# Patient Record
Sex: Male | Born: 1975 | Race: Black or African American | Hispanic: No | Marital: Single | Smoking: Former smoker
Health system: Southern US, Community
[De-identification: ages and names within clinical notes are randomized; demographics above are authoritative.]

## PROBLEM LIST (undated history)

## (undated) DIAGNOSIS — G43909 Migraine, unspecified, not intractable, without status migrainosus: Secondary | ICD-10-CM

## (undated) DIAGNOSIS — M109 Gout, unspecified: Secondary | ICD-10-CM

## (undated) DIAGNOSIS — I10 Essential (primary) hypertension: Secondary | ICD-10-CM

---

## 2002-08-30 ENCOUNTER — Emergency Department (HOSPITAL_COMMUNITY): Admission: EM | Admit: 2002-08-30 | Discharge: 2002-08-30 | Payer: Self-pay | Admitting: Emergency Medicine

## 2003-09-10 ENCOUNTER — Emergency Department (HOSPITAL_COMMUNITY): Admission: EM | Admit: 2003-09-10 | Discharge: 2003-09-10 | Payer: Self-pay | Admitting: Internal Medicine

## 2005-07-28 ENCOUNTER — Emergency Department (HOSPITAL_COMMUNITY): Admission: EM | Admit: 2005-07-28 | Discharge: 2005-07-28 | Payer: Self-pay | Admitting: Emergency Medicine

## 2011-03-16 ENCOUNTER — Observation Stay (HOSPITAL_COMMUNITY)
Admission: EM | Admit: 2011-03-16 | Discharge: 2011-03-18 | Disposition: A | Discharge status: Home / Self Care | Payer: Self-pay | Attending: Internal Medicine | Admitting: Internal Medicine

## 2011-03-16 ENCOUNTER — Emergency Department (HOSPITAL_COMMUNITY): Payer: Self-pay

## 2011-03-16 DIAGNOSIS — R51 Headache: Secondary | ICD-10-CM | POA: Insufficient documentation

## 2011-03-16 DIAGNOSIS — Z87891 Personal history of nicotine dependence: Secondary | ICD-10-CM | POA: Insufficient documentation

## 2011-03-16 DIAGNOSIS — E785 Hyperlipidemia, unspecified: Secondary | ICD-10-CM | POA: Insufficient documentation

## 2011-03-16 DIAGNOSIS — N189 Chronic kidney disease, unspecified: Secondary | ICD-10-CM | POA: Insufficient documentation

## 2011-03-16 DIAGNOSIS — E669 Obesity, unspecified: Secondary | ICD-10-CM | POA: Insufficient documentation

## 2011-03-16 DIAGNOSIS — I129 Hypertensive chronic kidney disease with stage 1 through stage 4 chronic kidney disease, or unspecified chronic kidney disease: Principal | ICD-10-CM | POA: Insufficient documentation

## 2011-03-16 LAB — DIFFERENTIAL
Basophils Absolute: 0.1 10*3/uL (ref 0.0–0.1)
Basophils Relative: 0 % (ref 0–1)
Eosinophils Absolute: 0.1 10*3/uL (ref 0.0–0.7)
Eosinophils Relative: 1 % (ref 0–5)
Lymphocytes Relative: 20 % (ref 12–46)
Lymphs Abs: 2.5 10*3/uL (ref 0.7–4.0)
Monocytes Absolute: 0.8 10*3/uL (ref 0.1–1.0)
Monocytes Relative: 6 % (ref 3–12)
Neutro Abs: 9.1 10*3/uL — ABNORMAL HIGH (ref 1.7–7.7)
Neutrophils Relative %: 73 % (ref 43–77)

## 2011-03-16 LAB — URINE MICROSCOPIC-ADD ON

## 2011-03-16 LAB — LIPID PANEL
Cholesterol: 204 mg/dL — ABNORMAL HIGH (ref 0–200)
HDL: 31 mg/dL — ABNORMAL LOW (ref >39)
Total CHOL/HDL Ratio: 6.6 RATIO
VLDL: 37 mg/dL (ref 0–40)

## 2011-03-16 LAB — CBC
HCT: 38.1 % — ABNORMAL LOW (ref 39.0–52.0)
Hemoglobin: 13.8 g/dL (ref 13.0–17.0)
MCH: 30 pg (ref 26.0–34.0)
MCHC: 36.2 g/dL — ABNORMAL HIGH (ref 30.0–36.0)
MCV: 82.8 fL (ref 78.0–100.0)
Platelets: 254 10*3/uL (ref 150–400)
RBC: 4.6 MIL/uL (ref 4.22–5.81)
RDW: 13.7 % (ref 11.5–15.5)
WBC: 12.5 10*3/uL — ABNORMAL HIGH (ref 4.0–10.5)

## 2011-03-16 LAB — URINALYSIS, ROUTINE W REFLEX MICROSCOPIC
Bilirubin Urine: NEGATIVE
Glucose, UA: NEGATIVE mg/dL
Hgb urine dipstick: NEGATIVE
Ketones, ur: NEGATIVE mg/dL
Nitrite: NEGATIVE
Protein, ur: 100 mg/dL — AB
Specific Gravity, Urine: 1.026 (ref 1.005–1.030)
Urobilinogen, UA: 0.2 mg/dL (ref 0.0–1.0)
pH: 5 (ref 5.0–8.0)

## 2011-03-16 LAB — BASIC METABOLIC PANEL
BUN: 23 mg/dL (ref 6–23)
CO2: 27 mEq/L (ref 19–32)
Calcium: 9.8 mg/dL (ref 8.4–10.5)
Chloride: 99 mEq/L (ref 96–112)
Creatinine, Ser: 2.39 mg/dL — ABNORMAL HIGH (ref 0.4–1.5)
GFR calc Af Amer: 38 mL/min — ABNORMAL LOW (ref >60)
GFR calc non Af Amer: 31 mL/min — ABNORMAL LOW (ref >60)
Glucose, Bld: 105 mg/dL — ABNORMAL HIGH (ref 70–99)
Potassium: 3.7 mEq/L (ref 3.5–5.1)
Sodium: 135 mEq/L (ref 135–145)

## 2011-03-16 LAB — RAPID URINE DRUG SCREEN, HOSP PERFORMED
Amphetamines: NOT DETECTED
Benzodiazepines: NOT DETECTED
Cocaine: NOT DETECTED

## 2011-03-17 LAB — TSH: TSH: 5.478 u[IU]/mL — ABNORMAL HIGH (ref 0.350–4.500)

## 2011-03-17 LAB — BASIC METABOLIC PANEL
CO2: 26 mEq/L (ref 19–32)
Calcium: 9.6 mg/dL (ref 8.4–10.5)
Chloride: 103 mEq/L (ref 96–112)
Creatinine, Ser: 2.36 mg/dL — ABNORMAL HIGH (ref 0.4–1.5)

## 2011-03-17 LAB — T4, FREE: Free T4: 1.27 ng/dL (ref 0.80–1.80)

## 2011-03-17 LAB — URINE CULTURE: Culture: NO GROWTH

## 2011-03-18 LAB — BASIC METABOLIC PANEL
BUN: 23 mg/dL (ref 6–23)
CO2: 25 mEq/L (ref 19–32)
Calcium: 9.3 mg/dL (ref 8.4–10.5)
Chloride: 101 mEq/L (ref 96–112)
Creatinine, Ser: 2.33 mg/dL — ABNORMAL HIGH (ref 0.4–1.5)
GFR calc Af Amer: 39 mL/min — ABNORMAL LOW (ref >60)

## 2011-03-18 LAB — CBC
Hemoglobin: 12.7 g/dL — ABNORMAL LOW (ref 13.0–17.0)
MCHC: 35 g/dL (ref 30.0–36.0)
RDW: 13.9 % (ref 11.5–15.5)

## 2011-03-28 NOTE — H&P (Signed)
NAME:  Hector Freeman, Hector Freeman NO.:  [id number]  MEDICAL RECORD NO.:  [id number]           PATIENT TYPE:  E  LOCATION:  [facility]                         FACILITY:  [facility]  PHYSICIAN:  Isidor Holts, M.D.  DATE OF BIRTH:  [birth year]  DATE OF ADMISSION:  03/16/2011 DATE OF DISCHARGE:                             HISTORY & PHYSICAL   PRIMARY MD:  None.  CHIEF COMPLAINT:  Headache for 1 day.  HISTORY OF PRESENT ILLNESS:  This is a 35 year old male, with past medical history significant only for hypertension and moderate obesity, who has experienced headaches on and off for approximately 1 year. However, today headache has recurred and is quite severe.  He denies associated visual obscuration, slurring of speech, or paresthesia. Denies chest pain or shortness of breath.  He was seen by ED MD, responded to analgesics, and was found to be markedly hypertensive with evidence of end-organ damage and was referred to the medical service for further management.  According to the patient, he was diagnosed with hypertension in 2004, was on hydrochlorothiazide for a few years, but stopped taking these medications after he lost his job a couple of years ago.  PAST MEDICAL HISTORY: 1. Hypertension, diagnosed in 2004. 2. Morbid obesity.  ALLERGIES:  No known drug allergies.  MEDICATION HISTORY:  Not on any regular medication.  REVIEW OF SYSTEMS:  As per HPI and chief complaint.  Denies abdominal pain, vomiting, or diarrhea.  Denies fever, chills.  Denies cough. Denies chest pain or shortness of breath.  Rest of systems review is negative.  SOCIAL HISTORY:  The patient used to work as a Psychiatric nurse, but became unemployed a couple years ago.  Nonsmoker, nondrinker.  Has no history of drug abuse.  He is single, has 3 offspring.  FAMILY HISTORY:  Mother is aged 52 years, she is hypertensive, dyslipidemic, hypothyroid.  The patient's father died at age 71 years, he was diabetic,  cause of death was presumably pulmonary embolism.  PHYSICAL EXAMINATION:  VITAL SIGNS:  Temperature 98.7, pulse 80 per minute regular, respiratory rate 18, BP 210/148 mmHg, pulse after administration of the B12 in the emergency department, rechecked 173/140 mmHg.  Pulse oximetry 98% on room air. GENERAL:  The patient appeared to be in mild discomfort from headache at the time of this evaluation; however, alert, communicative, not short of breath at rest. HEENT:  No clinical pallor.  No jaundice.  No conjunctival injection. NECK:  Supple.  JVP not seen.  No palpable lymphadenopathy.  No palpable goiter. CHEST:  Clinically clear to auscultation.  No wheezes or crackles. HEART:  Sounds S1 and S2 are heard normal, regular, no murmurs. ABDOMEN:  Morbidly obese, soft, nontender.  Unable to palpate the organs. EXTREMITIES:  Lower extremity examination, no pitting edema.  Palpable peripheral pulses. MUSCULOSKELETAL:  Unremarkable. CENTRAL NERVOUS SYSTEM:  No focal neurologic deficit on gross examination.  INVESTIGATIONS:  CBC, WBC 12.5, hemoglobin 13.5, hematocrit 38.1, platelets 254.  Electrolytes, sodium 135, potassium 3.7, chloride 99, CO2 of 27, BUN 23.  Creatinine 2.39, glucose 105.  Urinalysis shows wbc's 21-50, bacteria few.  ASSESSMENT AND PLAN: 1. Severe  uncontrolled hypertension/hypertensive urgency.  We shall     admit the patient, monitor telemetrically, utilize antihypertensive     medications.  Check lipid profile and TSH.  2. Hypertensive encephalopathy.  This is likely secondary to #1 above.     We shall do head CT scan for completeness, to rule out intracranial     pathology.  3. Acute renal failure versus chronic kidney disease.  The patient's     baseline creatinine is unknown at the present time and he is not on     any diuretic medications.  He is also not on NSAID therapy.  We     shall commence intravenous fluids.  Follow BUN and creatinine     levels, do renal  ultrasound scan.  Should there prove to be no     improvement in his renal function, he may indeed require the     services of a nephrologist.  4. Urinary tract infection.  The patient has a pyuria, which may be     suggestive of early urinary tract infection.  He has been started     on Keflex in the emergency department.  We shall continue this for     a 7-day course.  Meanwhile, we shall send off urine for cultures.  Further management will depend on clinical course.  The patient     will certainly need a primary MD, going forward.  We shall involve     the clinical social worker and care manager to facilitate this.     Isidor Holts, M.D.     CO/MEDQ  D:  03/16/2011  T:  03/16/2011  Job:  [id number]  Electronically Signed by Isidor Holts M.D. on 03/28/2011 04:54:19 PM

## 2011-04-13 NOTE — Discharge Summary (Signed)
NAME:  Hector Freeman, Hector Freeman NO.:  [id number]  MEDICAL RECORD NO.:  [id number]           PATIENT TYPE:  O  LOCATION:  3711                         FACILITY:  [facility]  PHYSICIAN:  Calvert Cantor, M.D.     DATE OF BIRTH:  08/15/1976  DATE OF ADMISSION:  03/16/2011 DATE OF DISCHARGE:  03/18/2011                              DISCHARGE SUMMARY   PRIMARY CARE PHYSICIAN:  None.  He is being referred to [facility].  He has an appointment for May 01, 2011.  CHIEF COMPLAINT:  Headache.  DISCHARGE DIAGNOSES: 1. Accelerated hypertension. 2. Headache, possibly secondary to accelerated hypertension. 3. Obesity. 4. Dyslipidemia. 5. Chronic kidney disease.  DISCHARGE MEDICATIONS: 1. Lisinopril/hydrochlorothiazide 10/12.5 one tablet daily. 2. Metoprolol 25 mg twice a day.  IMAGING RESULTS:  CT scan of the head without contrast performed on Mar 16, 2011 was negative.  Ultrasound of the kidneys performed on Mar 16, 2011 was also negative. Right kidney was 10.9 cm in length.  Left kidney 10.8 cm in length. Cortices appeared normal.  PERTINENT LABORATORY DATA:  Creatinine on admission 2.39 and creatinine on discharge 2.33.  Cholesterol 204, triglycerides 183, HDL 31, and LDL 136.  TSH 5.478.  Free T4 1.27.  Urine drug screen negative.  UA revealed moderate leukocytes, granular casts, and few bacteria. Urine culture was negative.  HOSPITAL COURSE:  This is a 35 year old male who came in with a complaint of a headache and was noted to have uncontrolled blood pressure; blood pressure was 210/148 per H and P.  He was started on antihypertensives, initially clonidine and Norvasc.  I switched these on the following day to lisinopril/hydrochlorothiazide and metoprolol.  The patient does have trouble affording medications and he would be able to afford these two at [facility].  I did not want to give him clonidine because if he ran out of his medications, he would have rebound  hypertension.  Blood pressure has improved and today is 137/79.  The patient is advised to take a low-sodium diet and to exercise and attempt to weight loss.  He has been advised that his cholesterol levels are high and has been recommended to take a low-fat diet as well and again to lose weight.  At this point, I am unsure if his elevated creatinine is secondary to chronic kidney disease or ATN he did have some granular casts in his urine which would point towards ATN.  Anyhow, I am recommending that he have a repeat metabolic panel once he follows up with [facility].  PHYSICAL EXAMINATION:  LUNGS:  Clear. HEART:  Regular rate and rhythm.  No murmurs. ABDOMEN:  Obese, soft, nontender, and nondistended.  Bowel sounds positive. EXTREMITIES:  No cyanosis, clubbing, or edema.  CONDITION ON DISCHARGE:  Stable.  FOLLOWUP INSTRUCTIONS:  Follow up with [facility].  He will need a repeat metabolic panel and he also needs a lipid profile in about 3 months.  TIME ON DISCHARGE:  50 minutes.     Calvert Cantor, M.D.     SR/MEDQ  D:  03/18/2011  T:  03/19/2011  Job:  [id number]  cc:   Clinic [facility]  Electronically Signed by Calvert Cantor M.D. on 04/13/2011 11:47:25 AM

## 2012-08-05 ENCOUNTER — Emergency Department (HOSPITAL_COMMUNITY)
Admission: EM | Admit: 2012-08-05 | Discharge: 2012-08-06 | Disposition: A | Discharge status: Home / Self Care | Payer: Self-pay | Attending: Emergency Medicine | Admitting: Emergency Medicine

## 2012-08-05 ENCOUNTER — Encounter (HOSPITAL_COMMUNITY): Payer: Self-pay | Admitting: *Deleted

## 2012-08-05 DIAGNOSIS — R51 Headache: Secondary | ICD-10-CM | POA: Insufficient documentation

## 2012-08-05 DIAGNOSIS — I10 Essential (primary) hypertension: Secondary | ICD-10-CM | POA: Insufficient documentation

## 2012-08-05 HISTORY — DX: Essential (primary) hypertension: I10

## 2012-08-05 LAB — COMPREHENSIVE METABOLIC PANEL
Alkaline Phosphatase: 62 U/L (ref 39–117)
CO2: 28 mEq/L (ref 19–32)
Calcium: 9.3 mg/dL (ref 8.4–10.5)
GFR calc non Af Amer: 38 mL/min — ABNORMAL LOW (ref >90)
Sodium: 134 mEq/L — ABNORMAL LOW (ref 135–145)
Total Bilirubin: 0.4 mg/dL (ref 0.3–1.2)

## 2012-08-05 LAB — CBC WITH DIFFERENTIAL/PLATELET
Basophils Relative: 0 % (ref 0–1)
Lymphocytes Relative: 25 % (ref 12–46)
MCHC: 34.2 g/dL (ref 30.0–36.0)
MCV: 83 fL (ref 78.0–100.0)
Monocytes Absolute: 0.5 10*3/uL (ref 0.1–1.0)
Platelets: 257 10*3/uL (ref 150–400)
RDW: 14 % (ref 11.5–15.5)
WBC: 9.3 10*3/uL (ref 4.0–10.5)

## 2012-08-05 NOTE — ED Notes (Signed)
Pt c/o headache/neck pain x 1 wk; denies fever; states has been out of bp meds x 2 months; bp 215/131;213/139

## 2012-08-06 ENCOUNTER — Emergency Department (HOSPITAL_COMMUNITY): Payer: Self-pay

## 2012-08-06 LAB — URINALYSIS, ROUTINE W REFLEX MICROSCOPIC
Bilirubin Urine: NEGATIVE
Hgb urine dipstick: NEGATIVE
Ketones, ur: NEGATIVE mg/dL
Nitrite: NEGATIVE
Protein, ur: 30 mg/dL — AB
pH: 5 (ref 5.0–8.0)

## 2012-08-06 MED ORDER — HYDROCHLOROTHIAZIDE 25 MG PO TABS
25.0000 mg | ORAL_TABLET | Freq: Once | ORAL | Status: AC
  Administered 2012-08-06: 25 mg via ORAL
  Filled 2012-08-06: qty 1

## 2012-08-06 MED ORDER — LISINOPRIL 10 MG PO TABS
10.0000 mg | ORAL_TABLET | Freq: Once | ORAL | Status: AC
  Administered 2012-08-06: 10 mg via ORAL
  Filled 2012-08-06: qty 1

## 2012-08-06 MED ORDER — LISINOPRIL 10 MG PO TABS: 10.0000 mg | ORAL_TABLET | Freq: Every day | ORAL | Status: DC

## 2012-08-06 MED ORDER — HYDROCHLOROTHIAZIDE 25 MG PO TABS: 25.0000 mg | ORAL_TABLET | Freq: Every day | ORAL | Status: DC

## 2012-08-06 NOTE — ED Notes (Signed)
Patient transported to CT 

## 2012-08-06 NOTE — ED Notes (Signed)
Dr. Norlene Campbell notified of patient's blood pressure. Okay to discharge patient. Instructed patient to get blood pressure filled this morning and to start on meds today.

## 2012-08-06 NOTE — ED Provider Notes (Signed)
History     CSN: [id number]  Arrival date & time 08/05/12  2227   First MD Initiated Contact with Patient 08/06/12 0234      Chief Complaint  Patient presents with  . Headache  . Hypertension    (Consider location/radiation/quality/duration/timing/severity/associated sxs/prior treatment) HPI 36 year old male presents to emergency department with complaint of one week of right-sided headache and neck pain. He denies any fever or chills or stiff neck. He denies any tick bites. Patient noted be hypertensive, has a history of same, has been off of his antihypertensive regimen for last 2-3 months due to [facility] closing. Patient denies any chest pain, no shortness of breath, no abdominal pain, no leg swelling. He denies any orthopnea or dyspnea on exertion. Patient has history of renal insufficiency noted on admission last year, for hypertensive urgency. Past Medical History  Diagnosis Date  . Hypertension     History reviewed. No pertinent past surgical history.  No family history on file.  History  Substance Use Topics  . Smoking status: Never Smoker   . Smokeless tobacco: Not on file  . Alcohol Use: No      Review of Systems  All other systems reviewed and are negative.    Allergies  Penicillins  Home Medications   Current Outpatient Rx  Name Route Sig Dispense Refill  . HYDROCHLOROTHIAZIDE 25 MG PO TABS Oral Take 1 tablet (25 mg total) by mouth daily. 60 tablet 0  . LISINOPRIL 10 MG PO TABS Oral Take 1 tablet (10 mg total) by mouth daily. 60 tablet 0    BP 213/138  Pulse 74  Temp 99.1 F (37.3 C)  Resp 20  SpO2 99%  Physical Exam  Nursing note and vitals reviewed. Constitutional: He is oriented to person, place, and time. He appears well-developed and well-nourished.  HENT:  Head: Normocephalic and atraumatic.  Nose: Nose normal.  Mouth/Throat: Oropharynx is clear and moist.  Eyes: Conjunctivae normal and EOM are normal. Pupils are equal, round,  and reactive to light.  Neck: Normal range of motion. Neck supple. No JVD present. No tracheal deviation present. No thyromegaly present.  Cardiovascular: Normal rate, regular rhythm, normal heart sounds and intact distal pulses.  Exam reveals no gallop and no friction rub.   No murmur heard. Pulmonary/Chest: Effort normal and breath sounds normal. No stridor. No respiratory distress. He has no wheezes. He has no rales. He exhibits no tenderness.  Abdominal: Soft. Bowel sounds are normal. He exhibits no distension and no mass. There is no tenderness. There is no rebound and no guarding.  Musculoskeletal: Normal range of motion. He exhibits no edema and no tenderness.  Lymphadenopathy:    He has no cervical adenopathy.  Neurological: He is alert and oriented to person, place, and time. He has normal reflexes. No cranial nerve deficit. He exhibits normal muscle tone. Coordination normal.  Skin: Skin is warm and dry. No rash noted. No erythema. No pallor.  Psychiatric: He has a normal mood and affect. His behavior is normal. Judgment and thought content normal.    ED Course  Procedures (including critical care time)  Labs Reviewed  COMPREHENSIVE METABOLIC PANEL - Abnormal; Notable for the following:    Sodium 134 (*)     Glucose, Bld 110 (*)     Creatinine, Ser 2.16 (*)     GFR calc non Af Amer 38 (*)     GFR calc Af Amer 44 (*)     All other components within normal  limits  URINALYSIS, ROUTINE W REFLEX MICROSCOPIC - Abnormal; Notable for the following:    APPearance CLOUDY (*)     Protein, ur 30 (*)     Leukocytes, UA SMALL (*)     All other components within normal limits  CBC WITH DIFFERENTIAL  URINE MICROSCOPIC-ADD ON  URINE CULTURE  GC/CHLAMYDIA PROBE AMP, URINE   Ct Head Wo Contrast  08/06/2012  *RADIOLOGY REPORT*  Clinical Data: Headache, hypertension  CT HEAD WITHOUT CONTRAST  Technique:  Contiguous axial images were obtained from the base of the skull through the vertex  without contrast.  Comparison: 03/16/2011  Findings: There is no evidence of acute intracranial hemorrhage, brain edema, mass lesion, acute infarction,   mass effect, or midline shift. Acute infarct may be inapparent on noncontrast CT. No other intra-axial abnormalities are seen, and the ventricles and sulci are within normal limits in size and symmetry.   No abnormal extra-axial fluid collections or masses are identified.  No significant calvarial abnormality.  IMPRESSION: 1. Negative for bleed or other acute intracranial process.   Original Report Authenticated By: Osa Craver, M.D.     Date: 08/06/2012  Rate: 80  Rhythm: normal sinus rhythm  QRS Axis: normal  Intervals: normal  ST/T Wave abnormalities: normal  Conduction Disutrbances:none  Narrative Interpretation: LVH LAA  Old EKG Reviewed: unchanged   1. Hypertension   2. Headache       MDM  36 yo male with h/o htn, CKD who has been off medications x 2-3 months now with headache, neck pain and htn.  No neuro deficits noted on exam, no abn on CT head.  Labwork shows stable renal insufficiency.  Will d/c home with lisinopril/hctz and local resource list        Olivia Mackie, MD 08/06/12 [phone]

## 2012-08-06 NOTE — ED Notes (Signed)
Patient ambulated to bathroom to obtain urine specimen.

## 2012-08-06 NOTE — ED Notes (Signed)
Patient returned from CT

## 2012-08-07 LAB — URINE CULTURE
Colony Count: NO GROWTH
Culture: NO GROWTH

## 2012-08-07 LAB — GC/CHLAMYDIA PROBE AMP, URINE
Chlamydia, Swab/Urine, PCR: NEGATIVE
GC Probe Amp, Urine: NEGATIVE

## 2012-11-11 ENCOUNTER — Emergency Department (INDEPENDENT_AMBULATORY_CARE_PROVIDER_SITE_OTHER)
Admission: EM | Admit: 2012-11-11 | Discharge: 2012-11-11 | Disposition: A | Discharge status: Home / Self Care | Payer: Self-pay | Source: Home / Self Care | Attending: Emergency Medicine | Admitting: Emergency Medicine

## 2012-11-11 ENCOUNTER — Encounter (HOSPITAL_COMMUNITY): Payer: Self-pay | Admitting: Emergency Medicine

## 2012-11-11 DIAGNOSIS — I10 Essential (primary) hypertension: Secondary | ICD-10-CM

## 2012-11-11 LAB — CBC WITH DIFFERENTIAL/PLATELET
Basophils Absolute: 0 10*3/uL (ref 0.0–0.1)
Basophils Relative: 0 % (ref 0–1)
Eosinophils Absolute: 0.2 10*3/uL (ref 0.0–0.7)
MCHC: 35 g/dL (ref 30.0–36.0)
Monocytes Absolute: 0.6 10*3/uL (ref 0.1–1.0)
Neutro Abs: 7.7 10*3/uL (ref 1.7–7.7)
Neutrophils Relative %: 71 % (ref 43–77)
RDW: 13.9 % (ref 11.5–15.5)

## 2012-11-11 LAB — POCT I-STAT, CHEM 8
Calcium, Ion: 1.26 mmol/L — ABNORMAL HIGH (ref 1.12–1.23)
Chloride: 104 mEq/L (ref 96–112)
HCT: 45 % (ref 39.0–52.0)
Hemoglobin: 15.3 g/dL (ref 13.0–17.0)
Potassium: 3.9 mEq/L (ref 3.5–5.1)

## 2012-11-11 NOTE — ED Provider Notes (Signed)
History     CSN: [id number]  Arrival date & time 11/11/12  1419   First MD Initiated Contact with Patient 11/11/12 1435      Chief Complaint  Patient presents with  . Dizziness    after taking BP med around 6/7 a.m this morning pt states that he felt dizzy "just dont feel right"    (Consider location/radiation/quality/duration/timing/severity/associated sxs/prior treatment) HPI Comments: Patient presents to urgent care describing that she is we'll call this morning started feeling somewhat dizzy. He admits that this morning he drank 8 ice coffee can is warm. At one point felt some chest pressure as he needed to burp. Patient denies any weakness, numbness or tingling sensations one of his upper or lower extremities or any facial expression changes or speech problems.  Denies any vomiting or diarrhea as.  Patient is a 37 y.o. male presenting with neurologic complaint.  Neurologic Problem The primary symptoms include headaches and dizziness. Primary symptoms do not include loss of consciousness, altered mental status, visual change, paresthesias, focal weakness, loss of sensation, speech change, memory loss, nausea or vomiting. The symptoms began 6 to 12 hours ago. The symptoms are improving.  The headache is not associated with photophobia, visual change, neck stiffness, paresthesias or weakness.  Dizziness does not occur with nausea, vomiting or weakness.  Additional symptoms do not include neck stiffness, weakness, photophobia or hearing loss. Medical issues also include hypertension. Workup history includes CT scan.    Past Medical History  Diagnosis Date  . Hypertension     History reviewed. No pertinent past surgical history.  History reviewed. No pertinent family history.  History  Substance Use Topics  . Smoking status: Former Smoker -- 0.5 packs/day    Types: Cigarettes  . Smokeless tobacco: Not on file  . Alcohol Use: No      Review of Systems  Constitutional:  Negative for activity change and appetite change.  HENT: Negative for hearing loss and neck stiffness.   Eyes: Negative for photophobia.  Respiratory: Negative for shortness of breath.   Cardiovascular: Negative for chest pain, palpitations and leg swelling.  Gastrointestinal: Negative for nausea and vomiting.  Musculoskeletal: Negative for arthralgias.  Skin: Negative for rash.  Neurological: Positive for dizziness and headaches. Negative for tremors, speech change, focal weakness, loss of consciousness, syncope, weakness, light-headedness, numbness and paresthesias.  Psychiatric/Behavioral: Negative for memory loss and altered mental status.    Allergies  Penicillins  Home Medications   Current Outpatient Rx  Name  Route  Sig  Dispense  Refill  . HYDROCHLOROTHIAZIDE 25 MG PO TABS   Oral   Take 1 tablet (25 mg total) by mouth daily.   60 tablet   0   . LISINOPRIL 10 MG PO TABS   Oral   Take 1 tablet (10 mg total) by mouth daily.   60 tablet   0   . METOPROLOL SUCCINATE ER 100 MG PO TB24   Oral   Take 100 mg by mouth daily. Take with or immediately following a meal.           BP 156/89  Pulse 82  Temp 100 F (37.8 C) (Oral)  Resp 18  SpO2 99%  Physical Exam  Nursing note reviewed. Constitutional: He is oriented to person, place, and time. He appears well-developed and well-nourished.  Non-toxic appearance. He does not have a sickly appearance. He does not appear ill. No distress.  HENT:  Head: Normocephalic.  Eyes: Conjunctivae normal are normal. Pupils are  equal, round, and reactive to light.  Neck: Neck supple. No JVD present.  Cardiovascular: Normal rate, regular rhythm, normal heart sounds and normal pulses.  Exam reveals no gallop and no friction rub.   No murmur heard. Pulmonary/Chest: Effort normal and breath sounds normal. He has no decreased breath sounds.  Neurological: He is alert and oriented to person, place, and time. He has normal strength. He  displays normal reflexes. No cranial nerve deficit or sensory deficit. He exhibits normal muscle tone. He displays a negative Romberg sign. Coordination normal. GCS eye subscore is 4. GCS verbal subscore is 5. GCS motor subscore is 6.  Skin: No erythema.    ED Course  Procedures (including critical care time)  Labs Reviewed  CBC WITH DIFFERENTIAL - Abnormal; Notable for the following:    WBC 10.9 (*)     All other components within normal limits  POCT I-STAT, CHEM 8 - Abnormal; Notable for the following:    Creatinine, Ser 2.10 (*)     Glucose, Bld 108 (*)     Calcium, Ion 1.26 (*)     All other components within normal limits   No results found.   1. Hypertension     EKG normal sinus rhythm ventricular rate of 69 beats per minute no ST or T wave abnormalities to suggest acute ischemia. PR interval and QRS duration within normal no QT prolongation MDM  Hypertension- with renal failure ( chronic since 2012), patient currently taking beta blockers and an ACE inhibitor. Patient needs close monitoring of blood pressure as this seemed to be unstable and fluctuant. Patient was discharged with a blood pressure of 150s/ 90s- and asymptomatic. Patient was advised about neurological and cardiovascular symptoms that should warrant immediate medical attention. [facility] week for blood pressure monitoring and medication adjustment.         Jimmie Molly, MD 11/11/12 1640

## 2012-11-11 NOTE — ED Notes (Signed)
Pt states "feels like heart is racing".

## 2012-11-11 NOTE — ED Notes (Signed)
Pt states this a.m around 6 or 7 after taking BP he felt dizzy. "just dont feel right" pt states that he usually takes meds with food but only had cold ice coffee. Pt has eat cereal and two cookies since but still doesn't feel right. Pt denies headaches, spotty/blurred vision.

## 2012-11-11 NOTE — ED Notes (Addendum)
Reg asst requests assessment of pt presenting to front desk with c/o HTN + dizziness.  Pt also reports consumption of a can of iced coffee earlier today to help alleviate some mild chest pressure sensation (described as a "need to burp").  Later pt says he began to feel dizzy and noted a painless pounding sensation in his temporal blood vessels. Pt is roomed and vitals obtained.

## 2012-11-12 ENCOUNTER — Emergency Department (HOSPITAL_COMMUNITY): Payer: Self-pay

## 2012-11-12 ENCOUNTER — Emergency Department (HOSPITAL_COMMUNITY)
Admission: EM | Admit: 2012-11-12 | Discharge: 2012-11-12 | Disposition: A | Discharge status: Home / Self Care | Payer: Self-pay | Attending: Emergency Medicine | Admitting: Emergency Medicine

## 2012-11-12 ENCOUNTER — Encounter (HOSPITAL_COMMUNITY): Payer: Self-pay | Admitting: *Deleted

## 2012-11-12 DIAGNOSIS — R0789 Other chest pain: Secondary | ICD-10-CM

## 2012-11-12 DIAGNOSIS — Z79899 Other long term (current) drug therapy: Secondary | ICD-10-CM | POA: Insufficient documentation

## 2012-11-12 DIAGNOSIS — Z7982 Long term (current) use of aspirin: Secondary | ICD-10-CM | POA: Insufficient documentation

## 2012-11-12 DIAGNOSIS — R071 Chest pain on breathing: Secondary | ICD-10-CM | POA: Insufficient documentation

## 2012-11-12 DIAGNOSIS — R42 Dizziness and giddiness: Secondary | ICD-10-CM | POA: Insufficient documentation

## 2012-11-12 DIAGNOSIS — I10 Essential (primary) hypertension: Secondary | ICD-10-CM | POA: Insufficient documentation

## 2012-11-12 DIAGNOSIS — Z87891 Personal history of nicotine dependence: Secondary | ICD-10-CM | POA: Insufficient documentation

## 2012-11-12 LAB — POCT I-STAT TROPONIN I: Troponin i, poc: 0 ng/mL (ref 0.00–0.08)

## 2012-11-12 LAB — POCT I-STAT, CHEM 8
Chloride: 101 mEq/L (ref 96–112)
Glucose, Bld: 92 mg/dL (ref 70–99)
HCT: 44 % (ref 39.0–52.0)
Hemoglobin: 15 g/dL (ref 13.0–17.0)
Potassium: 4.1 mEq/L (ref 3.5–5.1)
Sodium: 136 mEq/L (ref 135–145)

## 2012-11-12 LAB — CBC WITH DIFFERENTIAL/PLATELET
Basophils Absolute: 0 10*3/uL (ref 0.0–0.1)
Basophils Relative: 0 % (ref 0–1)
HCT: 41.7 % (ref 39.0–52.0)
Lymphocytes Relative: 16 % (ref 12–46)
Monocytes Absolute: 0.7 10*3/uL (ref 0.1–1.0)
Neutro Abs: 9.9 10*3/uL — ABNORMAL HIGH (ref 1.7–7.7)
Neutrophils Relative %: 78 % — ABNORMAL HIGH (ref 43–77)
RDW: 14.1 % (ref 11.5–15.5)
WBC: 12.8 10*3/uL — ABNORMAL HIGH (ref 4.0–10.5)

## 2012-11-12 LAB — D-DIMER, QUANTITATIVE: D-Dimer, Quant: 0.36 ug/mL-FEU (ref 0.00–0.48)

## 2012-11-12 MED ORDER — KETOROLAC TROMETHAMINE 30 MG/ML IJ SOLN
30.0000 mg | Freq: Once | INTRAMUSCULAR | Status: AC
  Administered 2012-11-12: 30 mg via INTRAVENOUS
  Filled 2012-11-12: qty 1

## 2012-11-12 MED ORDER — AMLODIPINE BESYLATE 5 MG PO TABS
5.0000 mg | ORAL_TABLET | Freq: Once | ORAL | Status: AC
  Administered 2012-11-12: 5 mg via ORAL
  Filled 2012-11-12: qty 1

## 2012-11-12 MED ORDER — AMLODIPINE BESYLATE 10 MG PO TABS: 5.0000 mg | ORAL_TABLET | Freq: Every day | ORAL | Status: DC

## 2012-11-12 MED ORDER — GI COCKTAIL ~~LOC~~
30.0000 mL | Freq: Once | ORAL | Status: AC
  Administered 2012-11-12: 30 mL via ORAL
  Filled 2012-11-12: qty 30

## 2012-11-12 NOTE — ED Notes (Signed)
Pt reports recurrent chest pain, describes it as sharp and pinching. Pt states this pain has been persistent x 2 months. Pt in no acute distress. Pt denies SHoB. Pt c/o dizziness but is not presently dizzy or lightheaded. Pt c/o diarrhea x 10 days. Pt denies n/v at this time. Pt is not pale or diaphoretic.

## 2012-11-12 NOTE — ED Notes (Signed)
Pt was seen at an urgent care for dizziness and heart racing today. He states he began to notice same feeling tonight along with chest pain. Pt denies SOB, discomfort in lt extremity, back or jaw. He denies nausea or diaphoresis. Pt appears in no distress while giving history of events today.

## 2012-11-12 NOTE — ED Provider Notes (Signed)
History     CSN: [id number]  Arrival date & time 11/12/12  0348   First MD Initiated Contact with Patient 11/12/12 [phone]      Chief Complaint  Patient presents with  . Chest Pain  . Dizziness    (Consider location/radiation/quality/duration/timing/severity/associated sxs/prior treatment) Patient is a 37 y.o. male presenting with chest pain. The history is provided by the patient. No language interpreter was used.  Chest Pain The chest pain began yesterday. Chest pain occurs constantly. The chest pain is unchanged. Associated with: nothing but now recalls he started doing push ups this week for the first time. The severity of the pain is moderate. The quality of the pain is described as dull. The pain does not radiate. Pertinent negatives for primary symptoms include no fever, no syncope, no shortness of breath, no cough, no palpitations, no nausea and no vomiting.  Pertinent negatives for associated symptoms include no claudication. He tried nothing for the symptoms. Risk factors include male gender.  Pertinent negatives for past medical history include no MI.  Procedure history is negative for cardiac catheterization and exercise treadmill test.     Past Medical History  Diagnosis Date  . Hypertension     History reviewed. No pertinent past surgical history.  History reviewed. No pertinent family history.  History  Substance Use Topics  . Smoking status: Former Smoker -- 0.5 packs/day    Types: Cigarettes  . Smokeless tobacco: Not on file  . Alcohol Use: No      Review of Systems  Constitutional: Negative for fever.  HENT: Negative for neck stiffness.   Respiratory: Negative for cough and shortness of breath.   Cardiovascular: Positive for chest pain. Negative for palpitations, claudication, leg swelling and syncope.  Gastrointestinal: Negative for nausea and vomiting.  All other systems reviewed and are negative.    Allergies  Penicillins  Home Medications    Current Outpatient Rx  Name  Route  Sig  Dispense  Refill  . ASPIRIN EC 81 MG PO TBEC   Oral   Take 81 mg by mouth daily.         Marland Kitchen HYDROCHLOROTHIAZIDE 25 MG PO TABS   Oral   Take 1 tablet (25 mg total) by mouth daily.   60 tablet   0   . LISINOPRIL 10 MG PO TABS   Oral   Take 1 tablet (10 mg total) by mouth daily.   60 tablet   0     BP 158/112  Pulse 86  Temp 99.3 F (37.4 C) (Oral)  Resp 20  SpO2 100%  Physical Exam  Constitutional: He is oriented to person, place, and time. He appears well-developed and well-nourished. No distress.  HENT:  Head: Normocephalic and atraumatic.  Mouth/Throat: Oropharynx is clear and moist.  Eyes: Conjunctivae normal are normal. Pupils are equal, round, and reactive to light.  Neck: Normal range of motion. Neck supple.  Cardiovascular: Normal rate and regular rhythm.   Pulmonary/Chest: Effort normal and breath sounds normal. He has no wheezes. He has no rales. He exhibits tenderness.  Abdominal: Soft. Bowel sounds are normal. There is no tenderness. There is no rebound and no guarding.  Musculoskeletal: Normal range of motion.  Neurological: He is alert and oriented to person, place, and time.  Skin: Skin is warm and dry.  Psychiatric: He has a normal mood and affect.    ED Course  Procedures (including critical care time)  Labs Reviewed  CBC WITH DIFFERENTIAL - Abnormal; Notable  for the following:    WBC 12.8 (*)     Neutrophils Relative 78 (*)     Neutro Abs 9.9 (*)     All other components within normal limits  POCT I-STAT, CHEM 8 - Abnormal; Notable for the following:    Creatinine, Ser 1.80 (*)     All other components within normal limits  D-DIMER, QUANTITATIVE  POCT I-STAT TROPONIN I   Dg Chest 2 View  11/12/2012  *RADIOLOGY REPORT*  Clinical Data: Chest pain, dizziness.  CHEST - 2 VIEW  Comparison: None  Findings: Heart and mediastinal contours are within normal limits. No focal opacities or effusions.  No  acute bony abnormality.  IMPRESSION: No active cardiopulmonary disease.   Original Report Authenticated By: Charlett Nose, M.D.      No diagnosis found.    MDM  Pain is clearly MSK in nature.  PERC negative wells 0. Will start anti-hypertensives.         Jasmine Awe, MD 11/12/12 [phone]

## 2013-01-04 ENCOUNTER — Emergency Department (HOSPITAL_COMMUNITY): Payer: Self-pay

## 2013-01-04 ENCOUNTER — Emergency Department (HOSPITAL_COMMUNITY)
Admission: EM | Admit: 2013-01-04 | Discharge: 2013-01-04 | Disposition: A | Discharge status: Home / Self Care | Payer: Self-pay | Attending: Emergency Medicine | Admitting: Emergency Medicine

## 2013-01-04 ENCOUNTER — Encounter (HOSPITAL_COMMUNITY): Payer: Self-pay | Admitting: Emergency Medicine

## 2013-01-04 DIAGNOSIS — Z7982 Long term (current) use of aspirin: Secondary | ICD-10-CM | POA: Insufficient documentation

## 2013-01-04 DIAGNOSIS — I10 Essential (primary) hypertension: Secondary | ICD-10-CM | POA: Insufficient documentation

## 2013-01-04 DIAGNOSIS — F17200 Nicotine dependence, unspecified, uncomplicated: Secondary | ICD-10-CM | POA: Insufficient documentation

## 2013-01-04 DIAGNOSIS — R0602 Shortness of breath: Secondary | ICD-10-CM | POA: Insufficient documentation

## 2013-01-04 DIAGNOSIS — Z79899 Other long term (current) drug therapy: Secondary | ICD-10-CM | POA: Insufficient documentation

## 2013-01-04 LAB — CBC WITH DIFFERENTIAL/PLATELET
Basophils Relative: 0 % (ref 0–1)
HCT: 43.1 % (ref 39.0–52.0)
Hemoglobin: 14.9 g/dL (ref 13.0–17.0)
Lymphocytes Relative: 23 % (ref 12–46)
Lymphs Abs: 2.2 10*3/uL (ref 0.7–4.0)
MCHC: 34.6 g/dL (ref 30.0–36.0)
Monocytes Absolute: 0.5 10*3/uL (ref 0.1–1.0)
Monocytes Relative: 5 % (ref 3–12)
Neutro Abs: 6.7 10*3/uL (ref 1.7–7.7)
Neutrophils Relative %: 70 % (ref 43–77)
RBC: 5.21 MIL/uL (ref 4.22–5.81)

## 2013-01-04 LAB — BASIC METABOLIC PANEL
BUN: 14 mg/dL (ref 6–23)
CO2: 24 mEq/L (ref 19–32)
Chloride: 97 mEq/L (ref 96–112)
Creatinine, Ser: 1.68 mg/dL — ABNORMAL HIGH (ref 0.50–1.35)
Glucose, Bld: 123 mg/dL — ABNORMAL HIGH (ref 70–99)
Potassium: 3.6 mEq/L (ref 3.5–5.1)

## 2013-01-04 MED ORDER — CLONIDINE HCL 0.2 MG PO TABS
0.2000 mg | ORAL_TABLET | Freq: Once | ORAL | Status: AC
  Administered 2013-01-04: 0.2 mg via ORAL
  Filled 2013-01-04: qty 1

## 2013-01-04 MED ORDER — LISINOPRIL-HYDROCHLOROTHIAZIDE 20-12.5 MG PO TABS: 1.0000 | ORAL_TABLET | Freq: Every day | ORAL | Status: DC

## 2013-01-04 MED ORDER — METOPROLOL TARTRATE 50 MG PO TABS: 50.0000 mg | ORAL_TABLET | Freq: Two times a day (BID) | ORAL | Status: DC

## 2013-01-04 MED ORDER — ACETAMINOPHEN 500 MG PO TABS
1000.0000 mg | ORAL_TABLET | Freq: Once | ORAL | Status: AC
  Administered 2013-01-04: 1000 mg via ORAL
  Filled 2013-01-04: qty 2

## 2013-01-04 NOTE — ED Notes (Addendum)
Intermittent central cp since this am with some sob/n/dizziness. Also c/o ha and neck tightness. nad noted. bp 189/122-pt states he has been on Norvasc x 1.5 weeks.

## 2013-01-04 NOTE — ED Provider Notes (Signed)
History  This chart was scribed for Donnetta Hutching, MD by Shari Heritage, ED Scribe. The patient was seen in room APA02/APA02. Patient's care was started at 1045.   CSN: [id number]  Arrival date & time 01/04/13  1031   First MD Initiated Contact with Patient 01/04/13 1045      Chief Complaint  Patient presents with  . Chest Pain     The history is provided by the patient. No language interpreter was used.    HPI Comments: Hector Freeman is a 37 y.o. male with history of hypertension who presents to the Emergency Department complaining of improving, moderate, intermittent, chest tightness; and improving, mild shortness of breath onset this morning. He says that prior to chest pain and shortness of breath, he had a headache and dizziness. He says that his BP prior to arrival was 189/122. Patient states that he has a history of poorly controlled hypertension since 2012. He says that he was hospitalized for 3 days in 2012 for treatment and to stabilize his blood pressure.  He is a former [facility] patient and states that it has been difficult for him to fill BP medicines. He says that he has taken lisinopril, metoprolol and HZT in the past. He was started on Norvasc 6 days ago after an ED visit; he takes half of a 10 mg tablet daily.   Past Medical History  Diagnosis Date  . Hypertension     History reviewed. No pertinent past surgical history.  No family history on file.  History  Substance Use Topics  . Smoking status: Current Some Day Smoker -- 0.50 packs/day    Types: Cigarettes  . Smokeless tobacco: Not on file  . Alcohol Use: No      Review of Systems A complete 10 system review of systems was obtained and all systems are negative except as noted in the HPI and PMH.   Allergies  Penicillins  Home Medications   Current Outpatient Rx  Name  Route  Sig  Dispense  Refill  . amLODipine (NORVASC) 10 MG tablet   Oral   Take 0.5 tablets (5 mg total) by mouth daily.   30  tablet   0   . aspirin EC 81 MG tablet   Oral   Take 81 mg by mouth daily.         . hydrochlorothiazide (HYDRODIURIL) 25 MG tablet   Oral   Take 1 tablet (25 mg total) by mouth daily.   60 tablet   0   . lisinopril (PRINIVIL,ZESTRIL) 10 MG tablet   Oral   Take 1 tablet (10 mg total) by mouth daily.   60 tablet   0     Triage Vitals: BP 189/122  Pulse 80  Temp(Src) 98.7 F (37.1 C)  Resp 18  Ht 5\' 7"  (1.702 m)  Wt 275 lb (124.739 kg)  BMI 43.06 kg/m2  SpO2 100%  Physical Exam  Nursing note and vitals reviewed. Constitutional: He is oriented to person, place, and time. He appears well-developed and well-nourished.  Overweight.  HENT:  Head: Normocephalic and atraumatic.  Eyes: Conjunctivae and EOM are normal. Pupils are equal, round, and reactive to light.  Neck: Normal range of motion. Neck supple.  Cardiovascular: Normal rate, regular rhythm and normal heart sounds.   Pulmonary/Chest: Effort normal and breath sounds normal.  Abdominal: Soft. Bowel sounds are normal.  Musculoskeletal: Normal range of motion.  Neurological: He is alert and oriented to person, place, and time.  Skin:  Skin is warm and dry.  Psychiatric: He has a normal mood and affect.    ED Course  Procedures (including critical care time) DIAGNOSTIC STUDIES: Oxygen Saturation is 100% on room air, normal by my interpretation.    COORDINATION OF CARE: 11:09 AM- Will work up for chest pain. Patient informed of current plan for treatment and evaluation and agrees with plan at this time.      Labs Reviewed  BASIC METABOLIC PANEL - Abnormal; Notable for the following:    Glucose, Bld 123 (*)    Creatinine, Ser 1.68 (*)    GFR calc non Af Amer 51 (*)    GFR calc Af Amer 59 (*)    All other components within normal limits  CBC WITH DIFFERENTIAL  TROPONIN I     Date: 01/04/2013  Rate: 91  Rhythm: normal sinus rhythm  QRS Axis: normal  Intervals: normal  ST/T Wave abnormalities: NS T  wave abnormality  Conduction Disutrbances:none  Narrative Interpretation:   Old EKG Reviewed: changes noted  No results found.   No diagnosis found.    MDM  Patient feeling better after clonidine. Discharge meds include lisinopril/ hydrochlorothiazide 12/12.5 and metoprolol 50 mg twice a day. Creatinine noted to be slightly elevated.      I personally performed the services described in this documentation, which was scribed in my presence. The recorded information has been reviewed and is accurate.    Donnetta Hutching, MD 01/04/13 [phone]

## 2013-01-18 ENCOUNTER — Emergency Department (HOSPITAL_COMMUNITY): Payer: Self-pay

## 2013-01-18 ENCOUNTER — Encounter (HOSPITAL_COMMUNITY): Payer: Self-pay | Admitting: *Deleted

## 2013-01-18 DIAGNOSIS — R079 Chest pain, unspecified: Secondary | ICD-10-CM | POA: Insufficient documentation

## 2013-01-18 DIAGNOSIS — Z79899 Other long term (current) drug therapy: Secondary | ICD-10-CM | POA: Insufficient documentation

## 2013-01-18 DIAGNOSIS — R110 Nausea: Secondary | ICD-10-CM | POA: Insufficient documentation

## 2013-01-18 DIAGNOSIS — F411 Generalized anxiety disorder: Secondary | ICD-10-CM | POA: Insufficient documentation

## 2013-01-18 DIAGNOSIS — F17200 Nicotine dependence, unspecified, uncomplicated: Secondary | ICD-10-CM | POA: Insufficient documentation

## 2013-01-18 DIAGNOSIS — I10 Essential (primary) hypertension: Secondary | ICD-10-CM | POA: Insufficient documentation

## 2013-01-18 NOTE — ED Notes (Signed)
Pt states that around 8 pm tonight (01/18/2013) that he began experiencing non radiating chest tightness, N/V/D, weakness, throbbing HA, burping.  Pain relived by burping, and exacerbated by lying on stomach.

## 2013-01-19 ENCOUNTER — Emergency Department (HOSPITAL_COMMUNITY)
Admission: EM | Admit: 2013-01-19 | Discharge: 2013-01-19 | Disposition: A | Discharge status: Home / Self Care | Payer: Self-pay | Attending: Emergency Medicine | Admitting: Emergency Medicine

## 2013-01-19 DIAGNOSIS — R079 Chest pain, unspecified: Secondary | ICD-10-CM

## 2013-01-19 LAB — CBC
HCT: 41.1 % (ref 39.0–52.0)
Hemoglobin: 14.4 g/dL (ref 13.0–17.0)
MCH: 28.8 pg (ref 26.0–34.0)
MCHC: 35 g/dL (ref 30.0–36.0)
MCV: 82.2 fL (ref 78.0–100.0)
RBC: 5 MIL/uL (ref 4.22–5.81)

## 2013-01-19 LAB — BASIC METABOLIC PANEL
BUN: 22 mg/dL (ref 6–23)
CO2: 26 mEq/L (ref 19–32)
Calcium: 10 mg/dL (ref 8.4–10.5)
GFR calc non Af Amer: 40 mL/min — ABNORMAL LOW (ref >90)
Glucose, Bld: 109 mg/dL — ABNORMAL HIGH (ref 70–99)
Sodium: 134 mEq/L — ABNORMAL LOW (ref 135–145)

## 2013-01-19 LAB — POCT I-STAT TROPONIN I: Troponin i, poc: 0 ng/mL (ref 0.00–0.08)

## 2013-01-19 MED ORDER — GI COCKTAIL ~~LOC~~
30.0000 mL | Freq: Once | ORAL | Status: AC
  Administered 2013-01-19: 30 mL via ORAL
  Filled 2013-01-19: qty 30

## 2013-01-19 NOTE — ED Notes (Signed)
PW Bowie at bedside

## 2013-01-19 NOTE — ED Provider Notes (Signed)
Medical screening examination/treatment/procedure(s) were performed by non-physician practitioner and as supervising physician I was immediately available for consultation/collaboration.  Olivia Mackie, MD 01/19/13 [phone]

## 2013-01-19 NOTE — ED Notes (Signed)
Pt updated on wait.  °

## 2013-01-19 NOTE — ED Provider Notes (Signed)
History     CSN: [id number]  Arrival date & time 01/18/13  2318   First MD Initiated Contact with Patient 01/19/13 0252      Chief Complaint  Patient presents with  . Chest Pain    (Consider location/radiation/quality/duration/timing/severity/associated sxs/prior treatment) HPI  37 year old male with history of hypertension presents complaining of chest pain. Patient reports he developed gradual onset of pain to his mid chest which started around 8 PM tonight. He described as a chest tightness sensation to his mid upper chest, nonradiating, with sensation of heart racing. He endorsed nausea without vomiting or diarrhea. He has history of heartburn but felt that this is different. He has tried to burp without relief of his symptoms. He felt anxious and noticed that pain is worsened when eating for pain minimally improved when he lean back. He denies fever, chills, pleuritic chest pain, hemoptysis, productive cough, lightheadedness, abdominal pain, back pain, or rash. He then denies family history of premature cardiac death, history of diabetes, and denies history of tobacco abuse. No prior history of DVT or PE. No recent surgery, prolonged bed rest, or fever, calf swelling or leg pain.  Past Medical History  Diagnosis Date  . Hypertension     No past surgical history on file.  No family history on file.  History  Substance Use Topics  . Smoking status: Current Some Day Smoker -- 0.50 packs/day    Types: Cigarettes  . Smokeless tobacco: Not on file  . Alcohol Use: No      Review of Systems  Constitutional:       A complete 10 system review of systems was obtained and all systems are negative except as noted in the HPI and PMH.    Allergies  Penicillins  Home Medications   Current Outpatient Rx  Name  Route  Sig  Dispense  Refill  . amLODipine (NORVASC) 10 MG tablet   Oral   Take 0.5 tablets (5 mg total) by mouth daily.   30 tablet   0   .  lisinopril-hydrochlorothiazide (ZESTORETIC) 20-12.5 MG per tablet   Oral   Take 1 tablet by mouth daily.   30 tablet   1   . metoprolol (LOPRESSOR) 50 MG tablet   Oral   Take 1 tablet (50 mg total) by mouth 2 (two) times daily.   60 tablet   1   . OVER THE COUNTER MEDICATION   Oral   Take 1 tablet by mouth once. otc pain reliver           BP 142/91  Pulse 66  Temp(Src) 98.4 F (36.9 C) (Oral)  Resp 17  SpO2 98%  Physical Exam  Nursing note and vitals reviewed. Constitutional: He is oriented to person, place, and time. He appears well-developed and well-nourished. No distress.  Awake, alert, nontoxic appearance  HENT:  Head: Atraumatic.  Mouth/Throat: Oropharynx is clear and moist.  Eyes: Conjunctivae are normal. Right eye exhibits no discharge. Left eye exhibits no discharge.  Neck: Normal range of motion. Neck supple. No JVD present.  Cardiovascular: Normal rate and regular rhythm.  Exam reveals no gallop and no friction rub.   No murmur heard. Pulmonary/Chest: Effort normal. No respiratory distress. He exhibits no tenderness.  Abdominal: Soft. There is no tenderness. There is no rebound.  Musculoskeletal: He exhibits no edema and no tenderness.  ROM appears intact, no obvious focal weakness  Neurological: He is alert and oriented to person, place, and time.  Skin: Skin is  warm and dry. No rash noted.  Psychiatric: He has a normal mood and affect.    ED Course  Procedures (including critical care time)   Date: 01/19/2013  Rate: 82  Rhythm: normal sinus rhythm  QRS Axis: normal  Intervals: normal  ST/T Wave abnormalities: nonspecific T wave changes  Conduction Disutrbances:none  Narrative Interpretation: mild hyperacute ST changes in anteriolateral leads  Old EKG Reviewed: unchanged    3:17 AM Patient presents with chest pain to his mid sternal region. Pain is nonreproducible on exam. Patient is currently in no acute distress with stable normal vital  sign. Low suspicion for pulmonary cardiac disease. PERC negative, TIMI 0.    5:29 AM Patient is chest pain-free for the past several hours. He has negative delta troponin. Chest x-ray shows no signs of infection. Care discussed with my attending who felt patient was stable for discharge.  Will give referral to [facility] for outpatient follow up.  Strict return precaution discussed.  Resources given.    Labs Reviewed  CBC - Abnormal; Notable for the following:    WBC 11.6 (*)    All other components within normal limits  BASIC METABOLIC PANEL - Abnormal; Notable for the following:    Sodium 134 (*)    Glucose, Bld 109 (*)    Creatinine, Ser 2.07 (*)    GFR calc non Af Amer 40 (*)    GFR calc Af Amer 46 (*)    All other components within normal limits  POCT I-STAT TROPONIN I   Dg Chest 2 View  01/19/2013  *RADIOLOGY REPORT*  Clinical Data: Chest pain, shortness of breath, weakness.  CHEST - 2 VIEW  Comparison: 01/04/2013  Findings: The heart size and pulmonary vascularity are normal. The lungs appear clear and expanded without focal air space disease or consolidation. No blunting of the costophrenic angles.  No pneumothorax.  Mediastinal contours appear intact.  No significant change since previous study.  IMPRESSION: No evidence of active pulmonary disease.   Original Report Authenticated By: Burman Nieves, M.D.      1. Chest pain       MDM  BP 121/78  Pulse 55  Temp(Src) 98.4 F (36.9 C) (Oral)  Resp 0  SpO2 99%  I have reviewed nursing notes and vital signs. I personally reviewed the imaging tests through PACS system  I reviewed available ER/hospitalization records thought the EMR         Fayrene Helper, [place] 01/19/13 0533

## 2013-03-02 ENCOUNTER — Encounter (HOSPITAL_COMMUNITY): Payer: Self-pay | Admitting: Emergency Medicine

## 2013-03-02 ENCOUNTER — Emergency Department (HOSPITAL_COMMUNITY)
Admission: EM | Admit: 2013-03-02 | Discharge: 2013-03-03 | Disposition: A | Discharge status: Home / Self Care | Payer: Self-pay | Attending: Emergency Medicine | Admitting: Emergency Medicine

## 2013-03-02 ENCOUNTER — Emergency Department (HOSPITAL_COMMUNITY): Payer: Self-pay

## 2013-03-02 DIAGNOSIS — R0989 Other specified symptoms and signs involving the circulatory and respiratory systems: Secondary | ICD-10-CM | POA: Insufficient documentation

## 2013-03-02 DIAGNOSIS — R0609 Other forms of dyspnea: Secondary | ICD-10-CM | POA: Insufficient documentation

## 2013-03-02 DIAGNOSIS — R110 Nausea: Secondary | ICD-10-CM | POA: Insufficient documentation

## 2013-03-02 DIAGNOSIS — R079 Chest pain, unspecified: Secondary | ICD-10-CM

## 2013-03-02 DIAGNOSIS — Z79899 Other long term (current) drug therapy: Secondary | ICD-10-CM | POA: Insufficient documentation

## 2013-03-02 DIAGNOSIS — Z880 Allergy status to penicillin: Secondary | ICD-10-CM | POA: Insufficient documentation

## 2013-03-02 DIAGNOSIS — I10 Essential (primary) hypertension: Secondary | ICD-10-CM | POA: Insufficient documentation

## 2013-03-02 DIAGNOSIS — F17200 Nicotine dependence, unspecified, uncomplicated: Secondary | ICD-10-CM | POA: Insufficient documentation

## 2013-03-02 DIAGNOSIS — R0789 Other chest pain: Secondary | ICD-10-CM | POA: Insufficient documentation

## 2013-03-02 LAB — POCT I-STAT TROPONIN I: Troponin i, poc: 0 ng/mL (ref 0.00–0.08)

## 2013-03-02 LAB — CBC
Hemoglobin: 14.6 g/dL (ref 13.0–17.0)
MCHC: 35.4 g/dL (ref 30.0–36.0)
RBC: 4.92 MIL/uL (ref 4.22–5.81)

## 2013-03-02 LAB — BASIC METABOLIC PANEL
GFR calc Af Amer: 47 mL/min — ABNORMAL LOW (ref >90)
GFR calc non Af Amer: 41 mL/min — ABNORMAL LOW (ref >90)
Potassium: 3.7 mEq/L (ref 3.5–5.1)
Sodium: 136 mEq/L (ref 135–145)

## 2013-03-02 MED ORDER — ASPIRIN 325 MG PO TABS
325.0000 mg | ORAL_TABLET | ORAL | Status: AC
  Administered 2013-03-02: 325 mg via ORAL
  Filled 2013-03-02: qty 1

## 2013-03-02 NOTE — ED Notes (Signed)
PT. REPORTS INTERMITTENT MID CHEST PAIN FOR SEVERAL WEEKS WITH SOB AND NAUSEA , DENIES EMESIS OR DIAPHORESIS. NO MEDS PTA. STATES HISTORY OF HYPERTENSION .

## 2013-03-03 LAB — POCT I-STAT TROPONIN I

## 2013-03-03 NOTE — ED Provider Notes (Signed)
History     CSN: [id number]  Arrival date & time 03/02/13  2252   First MD Initiated Contact with Patient 03/02/13 2320      Chief Complaint  Patient presents with  . Chest Pain    (Consider location/radiation/quality/duration/timing/severity/associated sxs/prior treatment) HPI Patient presents with chest pain. The patient has had intermittent episodes 4 weeks to months, and this episode began 2 days ago.  Since onset the pain has been persistent, left, superior, nonradiating, sore. There is no pleuritic or exertional change. There is mild dyspnea, though there is no new dyspnea on exertion. The patient also complains of mild nausea. No relief with Gas-X. History this is a history of hypertension, and is compliant on medication. He states since to recent evaluations for similar chest pain he has been unable to followup with a primary care physician for further evaluation and management. He states that this episode is very similar to those episodes.  Past Medical History  Diagnosis Date  . Hypertension     History reviewed. No pertinent past surgical history.  No family history on file.  History  Substance Use Topics  . Smoking status: Current Some Day Smoker -- 0.50 packs/day    Types: Cigarettes  . Smokeless tobacco: Not on file  . Alcohol Use: No      Review of Systems  All other systems reviewed and are negative.    Allergies  Penicillins  Home Medications   Current Outpatient Rx  Name  Route  Sig  Dispense  Refill  . lisinopril-hydrochlorothiazide (ZESTORETIC) 20-12.5 MG per tablet   Oral   Take 1 tablet by mouth daily.   30 tablet   1   . metoprolol (LOPRESSOR) 50 MG tablet   Oral   Take 1 tablet (50 mg total) by mouth 2 (two) times daily.   60 tablet   1     BP 127/81  Pulse 79  Temp(Src) 98.2 F (36.8 C) (Oral)  Resp 13  SpO2 98%  Physical Exam  Nursing note and vitals reviewed. Constitutional: He is oriented to person, place, and  time. He appears well-developed. No distress.  HENT:  Head: Normocephalic and atraumatic.  Eyes: Conjunctivae and EOM are normal.  Cardiovascular: Normal rate and regular rhythm.   Pulmonary/Chest: Effort normal. No stridor. No respiratory distress.  Abdominal: He exhibits no distension.  Musculoskeletal: He exhibits no edema.  Neurological: He is alert and oriented to person, place, and time.  Skin: Skin is warm and dry.  Psychiatric: He has a normal mood and affect.    ED Course  Procedures (including critical care time)  Labs Reviewed  BASIC METABOLIC PANEL - Abnormal; Notable for the following:    Glucose, Bld 105 (*)    Creatinine, Ser 2.04 (*)    GFR calc non Af Amer 41 (*)    GFR calc Af Amer 47 (*)    All other components within normal limits  CBC - Abnormal; Notable for the following:    WBC 11.6 (*)    All other components within normal limits  PRO B NATRIURETIC PEPTIDE  POCT I-STAT TROPONIN I   Dg Chest Port 1 View  03/02/2013  *RADIOLOGY REPORT*  Clinical Data: Chest pain  PORTABLE CHEST - 1 VIEW  Comparison: 01/18/2013  Findings: Lungs are clear. No pleural effusion or pneumothorax. The cardiomediastinal contours are within normal limits. The visualized bones and soft tissues are without significant appreciable abnormality.  IMPRESSION: No radiographic evidence of acute cardiopulmonary process.  Original Report Authenticated By: Jearld Lesch, M.D.      No diagnosis found.   Date: 03/03/2013  Rate: 93  Rhythm: normal sinus rhythm  QRS Axis: normal  Intervals: normal  ST/T Wave abnormalities: nonspecific T wave changes  Conduction Disutrbances:none  Narrative Interpretation:   Old EKG Reviewed: none available ABNORMAL  Cardiac 85 normal Pulse ox 99% room air normal After the initial evaluation I reviewed the patient's chart, including his multiple recent evaluations for chest pain.   1:38 AM #2 negative  patient remains in no distress documented in  stable MDM  This patient presents with recurrent ongoing chest pain. On exam he is in no distress, but no hypoxia, tachycardia, evidence of decompensation. Though the patient has hypertension, he is minimal other risk factors for CAD/ACS, and given his frequent presentations for chest pain, there is low suspicion for acute ongoing pathology. The patient received resources for primary care followup, was appropriate for discharge following this largely unremarkable evaluation, including nonischemic ECG, serial troponins that were negative.        Gerhard Munch, MD 03/03/13 [phone]

## 2013-06-01 ENCOUNTER — Emergency Department (HOSPITAL_COMMUNITY)
Admission: EM | Admit: 2013-06-01 | Discharge: 2013-06-01 | Disposition: A | Discharge status: Home / Self Care | Payer: Self-pay | Attending: Emergency Medicine | Admitting: Emergency Medicine

## 2013-06-01 ENCOUNTER — Encounter (HOSPITAL_COMMUNITY): Payer: Self-pay | Admitting: Emergency Medicine

## 2013-06-01 DIAGNOSIS — R5381 Other malaise: Secondary | ICD-10-CM | POA: Insufficient documentation

## 2013-06-01 DIAGNOSIS — Z79899 Other long term (current) drug therapy: Secondary | ICD-10-CM | POA: Insufficient documentation

## 2013-06-01 DIAGNOSIS — R072 Precordial pain: Secondary | ICD-10-CM | POA: Insufficient documentation

## 2013-06-01 DIAGNOSIS — F17200 Nicotine dependence, unspecified, uncomplicated: Secondary | ICD-10-CM | POA: Insufficient documentation

## 2013-06-01 DIAGNOSIS — M109 Gout, unspecified: Secondary | ICD-10-CM | POA: Insufficient documentation

## 2013-06-01 DIAGNOSIS — E049 Nontoxic goiter, unspecified: Secondary | ICD-10-CM | POA: Insufficient documentation

## 2013-06-01 DIAGNOSIS — I10 Essential (primary) hypertension: Secondary | ICD-10-CM | POA: Insufficient documentation

## 2013-06-01 LAB — CBC WITH DIFFERENTIAL/PLATELET
Basophils Absolute: 0 10*3/uL (ref 0.0–0.1)
Basophils Relative: 0 % (ref 0–1)
Eosinophils Absolute: 0.2 10*3/uL (ref 0.0–0.7)
Lymphs Abs: 2.5 10*3/uL (ref 0.7–4.0)
MCH: 29.9 pg (ref 26.0–34.0)
MCHC: 35.7 g/dL (ref 30.0–36.0)
Neutrophils Relative %: 65 % (ref 43–77)
Platelets: 290 10*3/uL (ref 150–400)
RBC: 5.15 MIL/uL (ref 4.22–5.81)
RDW: 13 % (ref 11.5–15.5)

## 2013-06-01 LAB — BASIC METABOLIC PANEL
Calcium: 9.6 mg/dL (ref 8.4–10.5)
Creatinine, Ser: 1.9 mg/dL — ABNORMAL HIGH (ref 0.50–1.35)
GFR calc non Af Amer: 44 mL/min — ABNORMAL LOW (ref >90)
Glucose, Bld: 106 mg/dL — ABNORMAL HIGH (ref 70–99)
Sodium: 136 mEq/L (ref 135–145)

## 2013-06-01 LAB — TSH: TSH: 2.007 u[IU]/mL (ref 0.350–4.500)

## 2013-06-01 MED ORDER — LISINOPRIL 20 MG PO TABS: 40.0000 mg | ORAL_TABLET | Freq: Every day | ORAL | Status: DC

## 2013-06-01 MED ORDER — ENALAPRILAT 1.25 MG/ML IV SOLN
1.2500 mg | Freq: Once | INTRAVENOUS | Status: AC
  Administered 2013-06-01: 1.25 mg via INTRAVENOUS
  Filled 2013-06-01: qty 1

## 2013-06-01 NOTE — ED Provider Notes (Addendum)
CSN: [id number]     Arrival date & time 06/01/13  1235 History     First MD Initiated Contact with Patient 06/01/13 1249     Chief Complaint  Patient presents with  . Weakness  . Hypertension  . Chest Pain    HPI   Pt states "My blood Pressure has been high".  Several episodes over last year of presentations for HBP and CP.  Out of meds for more than 1 month.  "pinching" in chest a few minutes yesterday. No SOB, no dyspnea.  NO GI complaints.  States Lopressor gave him worsening chest pain.  Got gout with HCTZ.  Tolerated lisinopril. Past Medical History  Diagnosis Date  . Hypertension    History reviewed. No pertinent past surgical history. History reviewed. No pertinent family history. History  Substance Use Topics  . Smoking status: Current Some Day Smoker -- 0.50 packs/day    Types: Cigarettes  . Smokeless tobacco: Not on file  . Alcohol Use: No    Review of Systems  Constitutional: Negative for fever, chills, diaphoresis, appetite change and fatigue.  HENT: Negative for sore throat, mouth sores and trouble swallowing.   Eyes: Negative for visual disturbance.  Respiratory: Negative for cough, chest tightness, shortness of breath and wheezing.   Cardiovascular: Negative for chest pain.  Gastrointestinal: Negative for nausea, vomiting, abdominal pain, diarrhea and abdominal distention.  Endocrine: Negative for polydipsia, polyphagia and polyuria.  Genitourinary: Negative for dysuria, frequency and hematuria.  Musculoskeletal: Negative for gait problem.  Skin: Negative for color change, pallor and rash.  Neurological: Negative for dizziness, syncope, light-headedness and headaches.  Hematological: Does not bruise/bleed easily.  Psychiatric/Behavioral: Negative for behavioral problems and confusion.    Allergies  Amlodipine and Penicillins  Home Medications   Current Outpatient Rx  Name  Route  Sig  Dispense  Refill  . lisinopril (PRINIVIL,ZESTRIL) 20 MG tablet  Oral   Take 2 tablets (40 mg total) by mouth daily.   30 tablet   1   . lisinopril-hydrochlorothiazide (ZESTORETIC) 20-12.5 MG per tablet   Oral   Take 1 tablet by mouth daily.   30 tablet   1   . metoprolol (LOPRESSOR) 50 MG tablet   Oral   Take 1 tablet (50 mg total) by mouth 2 (two) times daily.   60 tablet   1    BP 177/122  Pulse 73  Temp(Src) 98.3 F (36.8 C) (Oral)  Resp 16  SpO2 100% Physical Exam  Constitutional: He is oriented to person, place, and time. He appears well-developed and well-nourished. No distress.  HENT:  Head: Normocephalic.  Eyes: Conjunctivae are normal. Pupils are equal, round, and reactive to light. No scleral icterus.  Neck: Normal range of motion. Neck supple. Thyromegaly present.  Cardiovascular: Normal rate and regular rhythm.  Exam reveals no gallop and no friction rub.   No murmur heard. Pulmonary/Chest: Effort normal and breath sounds normal. No respiratory distress. He has no wheezes. He has no rales.  Abdominal: Soft. Bowel sounds are normal. He exhibits no distension. There is no tenderness. There is no rebound.  Musculoskeletal: Normal range of motion.  Neurological: He is alert and oriented to person, place, and time.  Skin: Skin is warm and dry. No rash noted.  No edema  Psychiatric: He has a normal mood and affect. His behavior is normal.    ED Course   Procedures (including critical care time)  Labs Reviewed  BASIC METABOLIC PANEL - Abnormal; Notable for the  following:    Glucose, Bld 106 (*)    Creatinine, Ser 1.90 (*)    GFR calc non Af Amer 44 (*)    GFR calc Af Amer 51 (*)    All other components within normal limits  CBC WITH DIFFERENTIAL  TSH  POCT I-STAT TROPONIN I   No results found. 1. Hypertension   2. Gout     MDM  Essential htn.  No findings to suggest Acute Coronary syndrome.  Sounds like intermittent gout, possibly exacerbated by HZTZ.  Neg troponin.  BP improving after IV vasotec. Encouraged  exercise, Na restriction, medicine compliance, and PCP follow up.  Discussed Cr c patient.  Still elevated at 1.9 (vs greater than 2 in past year).  Discussed re check within 2 weeks on meds to re check Cr on ACEI.  Claudean Kinds, MD 06/01/13 1517  Claudean Kinds, MD 06/01/13 [phone]

## 2013-06-01 NOTE — ED Notes (Signed)
Mother at bedside.

## 2013-06-01 NOTE — ED Notes (Signed)
Pt c/o mid sternal CP x 1 week; pt sts dizziness this week also and feeling generally weak; pt sts no htn meds x 3 months

## 2013-06-03 ENCOUNTER — Telehealth (HOSPITAL_COMMUNITY): Payer: Self-pay | Admitting: Emergency Medicine

## 2013-08-27 ENCOUNTER — Encounter (HOSPITAL_COMMUNITY): Payer: Self-pay | Admitting: Emergency Medicine

## 2013-08-27 ENCOUNTER — Emergency Department (HOSPITAL_COMMUNITY)
Admission: EM | Admit: 2013-08-27 | Discharge: 2013-08-27 | Disposition: A | Discharge status: Home / Self Care | Payer: Self-pay | Attending: Emergency Medicine | Admitting: Emergency Medicine

## 2013-08-27 ENCOUNTER — Emergency Department (HOSPITAL_COMMUNITY): Payer: Self-pay

## 2013-08-27 DIAGNOSIS — Z880 Allergy status to penicillin: Secondary | ICD-10-CM | POA: Insufficient documentation

## 2013-08-27 DIAGNOSIS — F17200 Nicotine dependence, unspecified, uncomplicated: Secondary | ICD-10-CM | POA: Insufficient documentation

## 2013-08-27 DIAGNOSIS — R0789 Other chest pain: Secondary | ICD-10-CM | POA: Insufficient documentation

## 2013-08-27 DIAGNOSIS — N189 Chronic kidney disease, unspecified: Secondary | ICD-10-CM | POA: Insufficient documentation

## 2013-08-27 DIAGNOSIS — I129 Hypertensive chronic kidney disease with stage 1 through stage 4 chronic kidney disease, or unspecified chronic kidney disease: Secondary | ICD-10-CM | POA: Insufficient documentation

## 2013-08-27 DIAGNOSIS — Z79899 Other long term (current) drug therapy: Secondary | ICD-10-CM | POA: Insufficient documentation

## 2013-08-27 LAB — CBC
Hemoglobin: 14.9 g/dL (ref 13.0–17.0)
MCH: 30 pg (ref 26.0–34.0)
MCHC: 35.2 g/dL (ref 30.0–36.0)
MCV: 85.1 fL (ref 78.0–100.0)
RBC: 4.97 MIL/uL (ref 4.22–5.81)

## 2013-08-27 LAB — POCT I-STAT, CHEM 8
BUN: 26 mg/dL — ABNORMAL HIGH (ref 6–23)
Creatinine, Ser: 2.1 mg/dL — ABNORMAL HIGH (ref 0.50–1.35)
Glucose, Bld: 87 mg/dL (ref 70–99)
Potassium: 3.6 mEq/L (ref 3.5–5.1)
Sodium: 138 mEq/L (ref 135–145)

## 2013-08-27 LAB — POCT I-STAT TROPONIN I

## 2013-08-27 MED ORDER — LISINOPRIL 20 MG PO TABS: 20.0000 mg | ORAL_TABLET | Freq: Every day | ORAL | Status: DC

## 2013-08-27 NOTE — ED Notes (Signed)
Pt in chest xray 

## 2013-08-27 NOTE — ED Notes (Signed)
Pt brought to ED with chest pain,central and non radiating with no SOB.Recived 324mg  asprin and nitro X1 with no relief of pain.

## 2013-08-27 NOTE — ED Notes (Signed)
Pt discharged.Vital signs stable and GCS 15.Discharge instruction given. 

## 2013-08-27 NOTE — ED Provider Notes (Addendum)
CSN: [id number]     Arrival date & time 08/27/13  1611 History   First MD Initiated Contact with Patient 08/27/13 1612     Chief Complaint  Patient presents with  . Chest Pain   (Consider location/radiation/quality/duration/timing/severity/associated sxs/prior Treatment) HPI complains of anterior chest pain feels like "a pop" onset upon awakening 10 AM today. The pain lasts for 2 or 3 seconds at a time nonradiating nothing makes symptoms better or worse nonexertional no nausea shortness of breath. He does report sweating his for 15 minutes nearly prior to arrival. He was treated with aspirin 324 mg and nitroglycerin one tablet by EMS prior to coming here. No other associated symptoms. He's had prior evaluation for similar in ED March 2014, was referred to cardiology for followup however did not followup. Point of anterior chest pain nonradiating feels like "a pop" onset upon awakening 10 AM today  Past Medical History  Diagnosis Date  . Hypertension    cardiac risk factors hypertension otherwise negative History reviewed. No pertinent past surgical history. No family history on file. History  Substance Use Topics  . Smoking status: Current Some Day Smoker -- 0.50 packs/day    Types: Cigarettes  . Smokeless tobacco: Not on file  . Alcohol Use: No   denies tobacco denies alcohol no illicit drug use  Review of Systems  Constitutional: Negative.   HENT: Negative.   Respiratory: Negative.   Cardiovascular: Positive for chest pain.  Gastrointestinal: Negative.   Musculoskeletal: Negative.   Skin: Negative.   Neurological: Negative.   Psychiatric/Behavioral: Negative.   All other systems reviewed and are negative.    Allergies  Amlodipine and Penicillins  Home Medications   Current Outpatient Rx  Name  Route  Sig  Dispense  Refill  . lisinopril (PRINIVIL,ZESTRIL) 20 MG tablet   Oral   Take 2 tablets (40 mg total) by mouth daily.   30 tablet   1   .  lisinopril-hydrochlorothiazide (ZESTORETIC) 20-12.5 MG per tablet   Oral   Take 1 tablet by mouth daily.   30 tablet   1   . metoprolol (LOPRESSOR) 50 MG tablet   Oral   Take 1 tablet (50 mg total) by mouth 2 (two) times daily.   60 tablet   1    There were no vitals taken for this visit. Physical Exam  Nursing note and vitals reviewed. Constitutional: He appears well-developed and well-nourished.  HENT:  Head: Normocephalic and atraumatic.  Eyes: Conjunctivae are normal. Pupils are equal, round, and reactive to light.  Neck: Neck supple. No tracheal deviation present. No thyromegaly present.  Cardiovascular: Normal rate and regular rhythm.   No murmur heard. Pulmonary/Chest: Effort normal and breath sounds normal.  Abdominal: Soft. Bowel sounds are normal. He exhibits no distension. There is no tenderness.  Obese  Musculoskeletal: Normal range of motion. He exhibits no edema and no tenderness.  Neurological: He is alert. Coordination normal.  Skin: Skin is warm and dry. No rash noted.  Psychiatric: He has a normal mood and affect.    ED Course  Procedures (including critical care time) Labs Review Labs Reviewed  CBC  COMPREHENSIVE METABOLIC PANEL   Imaging Review No results found.  EKG Interpretation     Ventricular Rate:  77 PR Interval:  177 QRS Duration: 75 QT Interval:  341 QTC Calculation: 386 R Axis:   35 Text Interpretation:  Sinus rhythm Probable left atrial enlargement ST elev, probable normal early repol pattern No significant change since  last tracing           Results for orders placed during the hospital encounter of 08/27/13  CBC      Result Value Range   WBC 10.1  4.0 - 10.5 K/uL   RBC 4.97  4.22 - 5.81 MIL/uL   Hemoglobin 14.9  13.0 - 17.0 g/dL   HCT 11.9  14.7 - 82.9 %   MCV 85.1  78.0 - 100.0 fL   MCH 30.0  26.0 - 34.0 pg   MCHC 35.2  30.0 - 36.0 g/dL   RDW 56.2  13.0 - 86.5 %   Platelets 269  150 - 400 K/uL  TROPONIN I       Result Value Range   Troponin I <0.30  <0.30 ng/mL  POCT I-STAT TROPONIN I      Result Value Range   Troponin i, poc 0.00  0.00 - 0.08 ng/mL   Comment 3           POCT I-STAT, CHEM 8      Result Value Range   Sodium 138  135 - 145 mEq/L   Potassium 3.6  3.5 - 5.1 mEq/L   Chloride 102  96 - 112 mEq/L   BUN 26 (*) 6 - 23 mg/dL   Creatinine, Ser 7.84 (*) 0.50 - 1.35 mg/dL   Glucose, Bld 87  70 - 99 mg/dL   Calcium, Ion 6.96 (*) 1.12 - 1.23 mmol/L   TCO2 24  0 - 100 mmol/L   Hemoglobin 15.6  13.0 - 17.0 g/dL   HCT 29.5  28.4 - 13.2 %  POCT I-STAT TROPONIN I      Result Value Range   Troponin i, poc 0.00  0.00 - 0.08 ng/mL   Comment 3            Dg Chest 2 View  08/27/2013   CLINICAL DATA:  Complains of chest pain. History of hypertension.  EXAM: CHEST  2 VIEW  COMPARISON:  03/02/2013  FINDINGS: The heart size and mediastinal contours are within normal limits. Both lungs are clear. The visualized skeletal structures are unremarkable.  IMPRESSION: No active cardiopulmonary disease.   Electronically Signed   By: Rosalie Gums M.D.   On: 08/27/2013 17:34    8:30 PM patient resting comfortably. states had a few more episodes of chest pain since he's been here. MDM  No diagnosis found. Chest xray viewed by me Chest pain is chronic and long-standing. He did he is noted to have chronic renal insufficiency. Most likely due to long-standing hypertension. Strongly doubt acute coronary syndrome given the highly atypical symptoms, to normal troponins, heart score 1, nonspecific EKG I feel the patient can be evaluated as an outpatient by primary care physician. Heart score equals 1.Will write rx for lisinopril, as pt about to run our, referral wellness ctr and resource guide Diagnosis #1 atypical chest pain #2 hypertension #3 chronic renal insufficiency    Doug Sou, MD 08/27/13 4401  Doug Sou, MD 08/27/13 2037

## 2014-03-05 ENCOUNTER — Encounter (HOSPITAL_COMMUNITY): Payer: Self-pay | Admitting: Emergency Medicine

## 2014-03-05 ENCOUNTER — Emergency Department (HOSPITAL_COMMUNITY)
Admission: EM | Admit: 2014-03-05 | Discharge: 2014-03-05 | Disposition: A | Discharge status: Home / Self Care | Payer: Self-pay | Attending: Emergency Medicine | Admitting: Emergency Medicine

## 2014-03-05 DIAGNOSIS — Z880 Allergy status to penicillin: Secondary | ICD-10-CM | POA: Insufficient documentation

## 2014-03-05 DIAGNOSIS — N289 Disorder of kidney and ureter, unspecified: Secondary | ICD-10-CM | POA: Insufficient documentation

## 2014-03-05 DIAGNOSIS — Z87891 Personal history of nicotine dependence: Secondary | ICD-10-CM | POA: Insufficient documentation

## 2014-03-05 DIAGNOSIS — R51 Headache: Secondary | ICD-10-CM

## 2014-03-05 DIAGNOSIS — I10 Essential (primary) hypertension: Secondary | ICD-10-CM | POA: Insufficient documentation

## 2014-03-05 DIAGNOSIS — R519 Headache, unspecified: Secondary | ICD-10-CM

## 2014-03-05 LAB — BASIC METABOLIC PANEL
BUN: 24 mg/dL — AB (ref 6–23)
CALCIUM: 9.8 mg/dL (ref 8.4–10.5)
CO2: 24 mEq/L (ref 19–32)
CREATININE: 1.9 mg/dL — AB (ref 0.50–1.35)
Chloride: 102 mEq/L (ref 96–112)
GFR, EST AFRICAN AMERICAN: 51 mL/min — AB (ref >90)
GFR, EST NON AFRICAN AMERICAN: 44 mL/min — AB (ref >90)
GLUCOSE: 104 mg/dL — AB (ref 70–99)
Potassium: 4.8 mEq/L (ref 3.7–5.3)
Sodium: 139 mEq/L (ref 137–147)

## 2014-03-05 MED ORDER — AMLODIPINE BESYLATE 5 MG PO TABS
5.0000 mg | ORAL_TABLET | Freq: Once | ORAL | Status: AC
  Administered 2014-03-05: 5 mg via ORAL
  Filled 2014-03-05: qty 1

## 2014-03-05 MED ORDER — HYDROCHLOROTHIAZIDE 12.5 MG PO TABS: 12.5000 mg | ORAL_TABLET | Freq: Every day | ORAL | Status: DC

## 2014-03-05 MED ORDER — AMLODIPINE BESYLATE 5 MG PO TABS: 5.0000 mg | ORAL_TABLET | Freq: Every day | ORAL | Status: DC

## 2014-03-05 MED ORDER — HYDROCHLOROTHIAZIDE 12.5 MG PO CAPS
12.5000 mg | ORAL_CAPSULE | Freq: Every day | ORAL | Status: DC
  Administered 2014-03-05: 12.5 mg via ORAL
  Filled 2014-03-05: qty 1

## 2014-03-05 MED ORDER — DIPHENHYDRAMINE HCL 25 MG PO CAPS
25.0000 mg | ORAL_CAPSULE | Freq: Once | ORAL | Status: AC
  Administered 2014-03-05: 25 mg via ORAL
  Filled 2014-03-05: qty 1

## 2014-03-05 MED ORDER — METOCLOPRAMIDE HCL 5 MG/ML IJ SOLN
10.0000 mg | Freq: Once | INTRAMUSCULAR | Status: AC
Start: 2014-03-05 — End: 2014-03-05
  Administered 2014-03-05: 10 mg via INTRAVENOUS
  Filled 2014-03-05: qty 2

## 2014-03-05 NOTE — ED Provider Notes (Signed)
CSN: [id number]     Arrival date & time 03/05/14  0926 History   First MD Initiated Contact with Patient 03/05/14 1158     Chief Complaint  Patient presents with  . Headache     (Consider location/radiation/quality/duration/timing/severity/associated sxs/prior Treatment) HPI Comments: Patient with PMH HTN (off medications for past month) -- presents with HA x 1 week that is described as tightness in neck, back of head. No head injury. Patient has had similar HA in past. No thunderclap onset. No fever, N/V. No photophobia or phonophobia. Patient denies signs of stroke including: facial droop, slurred speech, aphasia, weakness/numbness in extremities, imbalance/trouble walking. No vision loss but has had some times where he has had to squint to see. This is intermittent. No CP or SOB. No change in urination. He has appointment to see PCP regarding BP in 4 days. The onset of this condition was acute. The course is constant. Aggravating factors: none. He has taken ibuprofen which will help improve pain for the day but HA returns. No dental pain, sinus pressure.    The history is provided by the patient.    Past Medical History  Diagnosis Date  . Hypertension    History reviewed. No pertinent past surgical history. No family history on file. History  Substance Use Topics  . Smoking status: Former Smoker -- 0.50 packs/day    Types: Cigarettes  . Smokeless tobacco: Not on file  . Alcohol Use: No    Review of Systems  Constitutional: Negative for fever.  HENT: Negative for congestion, dental problem, rhinorrhea and sinus pressure.   Eyes: Negative for photophobia, discharge and redness.  Respiratory: Negative for shortness of breath.   Cardiovascular: Negative for chest pain.  Gastrointestinal: Negative for nausea and vomiting.  Genitourinary: Negative for decreased urine volume.  Musculoskeletal: Negative for gait problem, neck pain and neck stiffness.  Skin: Negative for rash.   Neurological: Positive for headaches. Negative for syncope, speech difficulty, weakness, light-headedness and numbness.  Psychiatric/Behavioral: Negative for confusion.    Allergies  Amlodipine and Penicillins  Home Medications   Prior to Admission medications   Medication Sig Start Date End Date Taking? Authorizing Provider  ibuprofen (ADVIL,MOTRIN) 200 MG tablet Take 200 mg by mouth 2 (two) times daily as needed for moderate pain.   Yes Historical Provider, MD  Olmesartan-Amlodipine-HCTZ (TRIBENZOR) 40-5-12.5 MG TABS Take 1 tablet by mouth daily.   Yes Historical Provider, MD   BP 191/117  Pulse 92  Temp(Src) 98 F (36.7 C) (Oral)  Resp 18  Ht 5\' 7"  (1.702 m)  Wt 270 lb (122.471 kg)  BMI 42.28 kg/m2  SpO2 100%  Physical Exam  Nursing note and vitals reviewed. Constitutional: He is oriented to person, place, and time. He appears well-developed and well-nourished.  HENT:  Head: Normocephalic and atraumatic.  Right Ear: Tympanic membrane, external ear and ear canal normal.  Left Ear: Tympanic membrane, external ear and ear canal normal.  Nose: Nose normal.  Mouth/Throat: Uvula is midline, oropharynx is clear and moist and mucous membranes are normal.  Eyes: Conjunctivae, EOM and lids are normal. Pupils are equal, round, and reactive to light.  Neck: Normal range of motion. Neck supple.  Cardiovascular: Normal rate and regular rhythm.   Pulmonary/Chest: Effort normal and breath sounds normal. No respiratory distress. He has no wheezes.  Abdominal: Soft. Bowel sounds are normal. He exhibits no pulsatile midline mass. There is no tenderness. There is no rebound and no guarding.  Musculoskeletal: Normal range of motion.  Cervical back: He exhibits normal range of motion, no tenderness and no bony tenderness.  Neurological: He is alert and oriented to person, place, and time. He has normal strength and normal reflexes. No cranial nerve deficit or sensory deficit. He exhibits  normal muscle tone. He displays a negative Romberg sign. Coordination and gait normal. GCS eye subscore is 4. GCS verbal subscore is 5. GCS motor subscore is 6.  Skin: Skin is warm and dry.  Psychiatric: He has a normal mood and affect.    ED Course  Procedures (including critical care time) Labs Review Labs Reviewed  BASIC METABOLIC PANEL - Abnormal; Notable for the following:    Glucose, Bld 104 (*)    BUN 24 (*)    Creatinine, Ser 1.90 (*)    GFR calc non Af Amer 44 (*)    GFR calc Af Amer 51 (*)    All other components within normal limits    Imaging Review No results found.   EKG Interpretation None      12:16 PM Patient seen and examined. Work-up initiated. Medications ordered.   Vital signs reviewed and are as follows: Filed Vitals:   03/05/14 1153  BP: 191/117  Pulse: 92  Temp:   Resp: 18   3:29 PM Patient improved after BP meds, reglan. Informed of renal function which is at his baseline.   He has PCP f/u in 4 days. Will give low dose of amlodipine and HCTZ and PCP can titrate as desired.   Patient counseled to return if they have weakness in their arms or legs, slurred speech, trouble walking or talking, confusion, trouble with their balance, or if they have any other concerns. Patient verbalizes understanding and agrees with plan.     MDM   Final diagnoses:  Renal insufficiency  HA (headache)   HA: Patient without high-risk features of headache including: sudden onset/thunderclap HA, no similar headache in past, altered mental status, accompanying seizure, headache with exertion, age > 750, history of immunocompromised, neck or shoulder pain, fever, use of anticoagulation, family history of spontaneous SAH, concomitant drug use, toxic exposure.   Patient has a normal complete neurological exam, normal vital signs, normal level of consciousness, no signs of meningismus, is well-appearing/non-toxic appearing, no signs of trauma, no pain over the temporal  arteries.   Imaging with CT/MRI not indicated given history and physical exam findings.   No dangerous or life-threatening conditions suspected or identified by history, physical exam, and by work-up. No indications for hospitalization identified.   HTN/renal insufficiency: No signs of end-organ damage other than baseline renal dysfunction. Normal neuro exam. No loss of eyesight. No CP/SOB. Pt has f/u in 4 days. Restart HTN meds in meantime.      Renne CriglerJoshua Lonnie Rosado, PA-C 03/05/14 [phone]

## 2014-03-05 NOTE — Discharge Planning (Signed)
[facility] Community Liaison  Spoke to patient about follow up care and establishing primary care. Ive spoke to patient in past ED visits. Patient states he is followed by a specialist for his hypertension but has no primary care. Patient given a resource guide to establish primary care. Patient expressed no concern about getting medications and states he is overall in good health. My contact information was provided for any future questions or concerns.

## 2014-03-05 NOTE — ED Provider Notes (Signed)
Medical screening examination/treatment/procedure(s) were performed by non-physician practitioner and as supervising physician I was immediately available for consultation/collaboration.   EKG Interpretation None       Derwood KaplanAnkit Valera Vallas, MD 03/05/14 1734

## 2014-03-05 NOTE — ED Notes (Signed)
Pt reports headache x 1 week with nausea, intermittent blurred vision. Hx of migraines.

## 2014-03-05 NOTE — Discharge Instructions (Signed)
Please read and follow all provided instructions.  Your diagnoses today include:  1. Renal insufficiency   2. HA (headache)     Tests performed today include:  Kidney function - weak kidneys but not worse than in the past  Vital signs. See below for your results today.   Medications:  In the Emergency Department you received:  Reglan - antinausea/headache medication  Benadryl - antihistamine to counteract potential side effects of reglan  Take any prescribed medications only as directed.  Additional information:  Follow any educational materials contained in this packet.  You are having a headache. No specific cause was found today for your headache. It may have been a migraine or other cause of headache. Stress, anxiety, fatigue, and depression are common triggers for headaches.   Your headache today does not appear to be life-threatening or require hospitalization, but often the exact cause of headaches is not determined in the emergency department. Therefore, follow-up with your doctor is very important to find out what may have caused your headache and whether or not you need any further diagnostic testing or treatment.   Sometimes headaches can appear benign (not harmful), but then more serious symptoms can develop which should prompt an immediate re-evaluation by your doctor or the emergency department.  BE VERY CAREFUL not to take multiple medicines containing Tylenol (also called acetaminophen). Doing so can lead to an overdose which can damage your liver and cause liver failure and possibly death.   Follow-up instructions: Please follow-up with your primary care provider in the next 3 days for further evaluation of your symptoms and discussion of your blood pressure. If you do not have a primary care doctor -- see below for referral information.   Return instructions:   Please return to the Emergency Department if you experience worsening symptoms.  Return if the  medications do not resolve your headache, if it recurs, or if you have multiple episodes of vomiting or cannot keep down fluids.  Return if you have a change from the usual headache.  RETURN IMMEDIATELY IF you:  Develop a sudden, severe headache  Develop confusion or become poorly responsive or faint  Develop a fever above 100.22F or problem breathing  Have a change in speech, vision, swallowing, or understanding  Develop new weakness, numbness, tingling, incoordination in your arms or legs  Have a seizure  Please return if you have any other emergent concerns.  Additional Information:  Your vital signs today were: BP 166/115   Pulse 74   Temp(Src) 98 F (36.7 C) (Oral)   Resp 18   Ht 5\' 7"  (1.702 m)   Wt 270 lb (122.471 kg)   BMI 42.28 kg/m2   SpO2 98% If your blood pressure (BP) was elevated above 135/85 this visit, please have this repeated by your doctor within one month. --------------

## 2014-03-05 NOTE — ED Notes (Signed)
Waiting on other medications from pharmacy

## 2014-03-05 NOTE — ED Notes (Signed)
Hector BleacherJosh Geiple at bedside

## 2014-06-06 DIAGNOSIS — Z79899 Other long term (current) drug therapy: Secondary | ICD-10-CM | POA: Insufficient documentation

## 2014-06-06 DIAGNOSIS — M79609 Pain in unspecified limb: Secondary | ICD-10-CM | POA: Insufficient documentation

## 2014-06-06 DIAGNOSIS — I10 Essential (primary) hypertension: Secondary | ICD-10-CM | POA: Insufficient documentation

## 2014-06-06 DIAGNOSIS — Z880 Allergy status to penicillin: Secondary | ICD-10-CM | POA: Insufficient documentation

## 2014-06-06 DIAGNOSIS — M7020 Olecranon bursitis, unspecified elbow: Secondary | ICD-10-CM | POA: Insufficient documentation

## 2014-06-06 DIAGNOSIS — Z87891 Personal history of nicotine dependence: Secondary | ICD-10-CM | POA: Insufficient documentation

## 2014-06-07 ENCOUNTER — Encounter (HOSPITAL_COMMUNITY): Payer: Self-pay | Admitting: Emergency Medicine

## 2014-06-07 ENCOUNTER — Emergency Department (HOSPITAL_COMMUNITY)
Admission: EM | Admit: 2014-06-07 | Discharge: 2014-06-07 | Disposition: A | Discharge status: Home / Self Care | Payer: Self-pay | Attending: Emergency Medicine | Admitting: Emergency Medicine

## 2014-06-07 DIAGNOSIS — M7022 Olecranon bursitis, left elbow: Secondary | ICD-10-CM

## 2014-06-07 MED ORDER — CEPHALEXIN 500 MG PO CAPS: 500.0000 mg | ORAL_CAPSULE | Freq: Four times a day (QID) | ORAL | Status: DC

## 2014-06-07 MED ORDER — HYDROCODONE-ACETAMINOPHEN 5-325 MG PO TABS
1.0000 | ORAL_TABLET | ORAL | Status: DC | PRN
Start: 2014-06-07 — End: 2014-07-29

## 2014-06-07 MED ORDER — IBUPROFEN 800 MG PO TABS: 800.0000 mg | ORAL_TABLET | Freq: Three times a day (TID) | ORAL | Status: DC

## 2014-06-07 NOTE — Discharge Instructions (Signed)
Bursitis °Bursitis is a swelling and soreness (inflammation) of a fluid-filled sac (bursa) that overlies and protects a joint. It can be caused by injury, overuse of the joint, arthritis or infection. The joints most likely to be affected are the elbows, shoulders, hips and knees. °HOME CARE INSTRUCTIONS  °· Apply ice to the affected area for 15-20 minutes each hour while awake for 2 days. Put the ice in a plastic bag and place a towel between the bag of ice and your skin. °· Rest the injured joint as much as possible, but continue to put the joint through a full range of motion, 4 times per day. (The shoulder joint especially becomes rapidly "frozen" if not used.) When the pain lessens, begin normal slow movements and usual activities. °· Only take over-the-counter or prescription medicines for pain, discomfort or fever as directed by your caregiver. °· Your caregiver may recommend draining the bursa and injecting medicine into the bursa. This may help the healing process. °· Follow all instructions for follow-up with your caregiver. This includes any orthopedic referrals, physical therapy and rehabilitation. Any delay in obtaining necessary care could result in a delay or failure of the bursitis to heal and chronic pain. °SEEK IMMEDIATE MEDICAL CARE IF:  °· Your pain increases even during treatment. °· You develop an oral temperature above 102° F (38.9° C) and have heat and inflammation over the involved bursa. °MAKE SURE YOU:  °· Understand these instructions. °· Will watch your condition. °· Will get help right away if you are not doing well or get worse. °Document Released: 10/15/2000 Document Revised: 01/10/2012 Document Reviewed: 01/07/2014 °Exit[employer]® Patient Information ©2015 [employer]. This information is not intended to replace advice given to you by your health care provider. Make sure you discuss any questions you have with your health care provider. ° °Heat Therapy °Heat therapy can help ease sore,  stiff, injured, and tight muscles and joints. Heat relaxes your muscles, which may help ease your pain.  °RISKS AND COMPLICATIONS °If you have any of the following conditions, do not use heat therapy unless your health care provider has approved: °· Poor circulation. °· Healing wounds or scarred skin in the area being treated. °· Diabetes, heart disease, or high blood pressure. °· Not being able to feel (numbness) the area being treated. °· Unusual swelling of the area being treated. °· Active infections. °· Blood clots. °· Cancer. °· Inability to communicate pain. This may include young children and people who have problems with their brain function (dementia). °· Pregnancy. °Heat therapy should only be used on old, pre-existing, or long-lasting (chronic) injuries. Do not use heat therapy on new injuries unless directed by your health care provider. °HOW TO USE HEAT THERAPY °There are several different kinds of heat therapy, including: °· Moist heat pack. °· Warm water bath. °· Hot water bottle. °· Electric heating pad. °· Heated gel pack. °· Heated wrap. °· Electric heating pad. °Use the heat therapy method suggested by your health care provider. Follow your health care provider's instructions on when and how to use heat therapy. °GENERAL HEAT THERAPY RECOMMENDATIONS °· Do not sleep while using heat therapy. Only use heat therapy while you are awake. °· Your skin may turn pink while using heat therapy. Do not use heat therapy if your skin turns red. °· Do not use heat therapy if you have new pain. °· High heat or long exposure to heat can cause burns. Be careful when using heat therapy to avoid burning   your skin. °· Do not use heat therapy on areas of your skin that are already irritated, such as with a rash or sunburn. °SEEK MEDICAL CARE IF: °· You have blisters, redness, swelling, or numbness. °· You have new pain. °· Your pain is worse. °MAKE SURE YOU: °· Understand these instructions. °· Will watch your  condition. °· Will get help right away if you are not doing well or get worse. °Document Released: 01/10/2012 Document Revised: 03/04/2014 Document Reviewed: 12/11/2013 °ExitCare® Patient Information ©2015 [employer]. This information is not intended to replace advice given to you by your health care provider. Make sure you discuss any questions you have with your health care provider. ° °

## 2014-06-07 NOTE — ED Notes (Signed)
Declined W/C at D/C and was escorted to lobby by RN. 

## 2014-06-07 NOTE — ED Notes (Signed)
Patient here with complaint of left elbow pain with flexion. States difficulty and pain with movement of arm. Flexion of arm does not increase pain, but extension increases pain. Denies injury. No other complaints.

## 2014-06-07 NOTE — ED Provider Notes (Signed)
CSN: [id number]     Arrival date & time 06/06/14  2357 History   First MD Initiated Contact with Patient 06/07/14 [phone]     Chief Complaint  Patient presents with  . Arm Pain     (Consider location/radiation/quality/duration/timing/severity/associated sxs/prior Treatment) Patient is a 38 y.o. male presenting with arm pain. The history is provided by the patient. No language interpreter was used.  Arm Pain This is a new problem. The current episode started in the past 7 days. Pertinent negatives include no fever or numbness. Associated symptoms comments: Left arm pain at posterior elbow without known injury. He reports increased pain with flexion of elbow joint. No fever. No history of same..    Past Medical History  Diagnosis Date  . Hypertension    History reviewed. No pertinent past surgical history. History reviewed. No pertinent family history. History  Substance Use Topics  . Smoking status: Former Smoker -- 0.50 packs/day    Types: Cigarettes    Quit date: 12/31/2011  . Smokeless tobacco: Not on file  . Alcohol Use: No    Review of Systems  Constitutional: Negative for fever.  Musculoskeletal:       See HPI.  Skin: Negative for color change and wound.  Neurological: Negative for numbness.      Allergies  Amlodipine and Penicillins  Home Medications   Prior to Admission medications   Medication Sig Start Date End Date Taking? Authorizing Provider  amLODipine (NORVASC) 5 MG tablet Take 1 tablet (5 mg total) by mouth daily. 03/05/14   Renne CriglerJoshua Geiple, PA-C  hydrochlorothiazide (HYDRODIURIL) 12.5 MG tablet Take 1 tablet (12.5 mg total) by mouth daily. 03/05/14   Renne CriglerJoshua Geiple, PA-C  ibuprofen (ADVIL,MOTRIN) 200 MG tablet Take 200 mg by mouth 2 (two) times daily as needed for moderate pain.    Historical Provider, MD  Olmesartan-Amlodipine-HCTZ (TRIBENZOR) 40-5-12.5 MG TABS Take 1 tablet by mouth daily.    Historical Provider, MD   BP 146/94  Pulse 91  Temp(Src) 99.6 F  (37.6 C) (Oral)  Resp 20  Wt 270 lb (122.471 kg)  SpO2 97% Physical Exam  Constitutional: He is oriented to person, place, and time. He appears well-developed and well-nourished. No distress.  Musculoskeletal:  Left posterior elbow warm to touch with moderate swelling over olecranon process. No fluctuance or induration.   Neurological: He is alert and oriented to person, place, and time.  Skin: Skin is warm and dry. No erythema.  Psychiatric: He has a normal mood and affect.    ED Course  Procedures (including critical care time) Labs Review Labs Reviewed - No data to display  Imaging Review No results found.   EKG Interpretation None      MDM   Final diagnoses:  None    1. Olecranon bursitis  No redness, history of injury or of fever. Joint is warm to the touch posteriorly, will cover with abx and encourage recheck with ortho if symptoms persist or worsen.    Arnoldo HookerShari A Shyah Cadmus, PA-C 06/07/14 0105

## 2014-06-10 NOTE — ED Provider Notes (Signed)
Medical screening examination/treatment/procedure(s) were performed by non-physician practitioner and as supervising physician I was immediately available for consultation/collaboration.     Bartholomew Ramesh E SteiSuzi Rootsnl, MD 06/10/14 [phone]

## 2014-07-29 ENCOUNTER — Encounter (HOSPITAL_COMMUNITY): Payer: Self-pay | Admitting: Emergency Medicine

## 2014-07-29 ENCOUNTER — Emergency Department (HOSPITAL_COMMUNITY)
Admission: EM | Admit: 2014-07-29 | Discharge: 2014-07-29 | Disposition: A | Discharge status: Home / Self Care | Payer: Self-pay | Attending: Emergency Medicine | Admitting: Emergency Medicine

## 2014-07-29 ENCOUNTER — Emergency Department (HOSPITAL_COMMUNITY): Payer: Self-pay

## 2014-07-29 DIAGNOSIS — Z880 Allergy status to penicillin: Secondary | ICD-10-CM | POA: Insufficient documentation

## 2014-07-29 DIAGNOSIS — Z79899 Other long term (current) drug therapy: Secondary | ICD-10-CM | POA: Insufficient documentation

## 2014-07-29 DIAGNOSIS — R509 Fever, unspecified: Secondary | ICD-10-CM | POA: Insufficient documentation

## 2014-07-29 DIAGNOSIS — Z87891 Personal history of nicotine dependence: Secondary | ICD-10-CM | POA: Insufficient documentation

## 2014-07-29 DIAGNOSIS — M109 Gout, unspecified: Secondary | ICD-10-CM | POA: Insufficient documentation

## 2014-07-29 DIAGNOSIS — I10 Essential (primary) hypertension: Secondary | ICD-10-CM | POA: Insufficient documentation

## 2014-07-29 DIAGNOSIS — M25579 Pain in unspecified ankle and joints of unspecified foot: Secondary | ICD-10-CM | POA: Insufficient documentation

## 2014-07-29 LAB — I-STAT CHEM 8, ED
BUN: 27 mg/dL — ABNORMAL HIGH (ref 6–23)
Calcium, Ion: 1.23 mmol/L (ref 1.12–1.23)
Chloride: 102 mEq/L (ref 96–112)
Creatinine, Ser: 1.9 mg/dL — ABNORMAL HIGH (ref 0.50–1.35)
GLUCOSE: 95 mg/dL (ref 70–99)
HCT: 45 % (ref 39.0–52.0)
HEMOGLOBIN: 15.3 g/dL (ref 13.0–17.0)
POTASSIUM: 4.6 meq/L (ref 3.7–5.3)
Sodium: 139 mEq/L (ref 137–147)
TCO2: 27 mmol/L (ref 0–100)

## 2014-07-29 MED ORDER — HYDROCODONE-ACETAMINOPHEN 5-325 MG PO TABS: 1.0000 | ORAL_TABLET | ORAL | Status: AC | PRN

## 2014-07-29 MED ORDER — HYDROCODONE-ACETAMINOPHEN 5-325 MG PO TABS
2.0000 | ORAL_TABLET | Freq: Once | ORAL | Status: AC
  Administered 2014-07-29: 2 via ORAL
  Filled 2014-07-29: qty 2

## 2014-07-29 MED ORDER — INDOMETHACIN 25 MG PO CAPS: 50.0000 mg | ORAL_CAPSULE | Freq: Three times a day (TID) | ORAL | Status: AC | PRN

## 2014-07-29 NOTE — ED Provider Notes (Signed)
CSN: [id number]     Arrival date & time 07/29/14  1618 History  This chart was scribed for Hector Battiest, NP working with Hurman Horn, MD by Evon Slack, ED Scribe. This patient was seen in room TR06C/TR06C and the patient's care was started at 4:59 PM.      Chief Complaint  Patient presents with  . Ankle Pain   Patient is a 38 y.o. male presenting with ankle pain. The history is provided by the patient. No language interpreter was used.  Ankle Pain Associated symptoms: fever    HPI Comments: Hector Freeman is a 38 y.o. male who presents to the Emergency Department complaining of recurrent aching and throbbing left ankle pain onset 2 days prior. He states the pain recently worsend 1 day ago. He states that yesterday his pain was 10/10. He states he has associated swelling and subjective fever. He states that it difficult to move his ankle and toes. He denies injury to the ankle. He states he has been having intermittent  episodes of similar ankle pain and swelling since 2012. He states that he is concerned that he may have gout.     Past Medical History  Diagnosis Date  . Hypertension    History reviewed. No pertinent past surgical history. No family history on file. History  Substance Use Topics  . Smoking status: Former Smoker -- 0.50 packs/day    Types: Cigarettes    Quit date: 12/31/2011  . Smokeless tobacco: Not on file  . Alcohol Use: No    Review of Systems  Constitutional: Positive for fever.  Musculoskeletal: Positive for arthralgias and joint swelling.    Allergies  Amlodipine and Penicillins  Home Medications   Prior to Admission medications   Medication Sig Start Date End Date Taking? Authorizing Provider  hydrochlorothiazide (HYDRODIURIL) 12.5 MG tablet Take 1 tablet (12.5 mg total) by mouth daily. 03/05/14  Yes Renne Crigler, PA-C  ibuprofen (ADVIL,MOTRIN) 200 MG tablet Take 200 mg by mouth 2 (two) times daily as needed for moderate pain.   Yes  Historical Provider, MD  Olmesartan-Amlodipine-HCTZ (TRIBENZOR) 40-5-12.5 MG TABS Take 1 tablet by mouth daily.   Yes Historical Provider, MD   Triage Vitals: BP 168/103  Pulse 97  Temp(Src) 98.5 F (36.9 C)  Resp 18  Wt 281 lb 3 oz (127.546 kg)  SpO2 97%  Physical Exam  Nursing note and vitals reviewed. Constitutional: He is oriented to person, place, and time. He appears well-developed and well-nourished. No distress.  HENT:  Head: Normocephalic and atraumatic.  Eyes: Conjunctivae and EOM are normal.  Neck: Neck supple.  Cardiovascular: Normal rate, S1 normal, S2 normal and normal heart sounds.  Exam reveals no gallop and no friction rub.   No murmur heard. Pulses:      Dorsalis pedis pulses are 2+ on the right side, and 2+ on the left side.  Pulmonary/Chest: Effort normal. No respiratory distress.  Musculoskeletal: Normal range of motion.  Both medial and lateral swelling to ankle, tenderness to palpation to dorsal aspect of left foot, 5/5 strength with dorsi and plantar flexion, 2nd and 3rd toe tender to palpation    Neurological: He is alert and oriented to person, place, and time.  Skin: Skin is warm and dry.  Psychiatric: He has a normal mood and affect. His behavior is normal.    ED Course  Procedures (including critical care time) DIAGNOSTIC STUDIES: Oxygen Saturation is 97% on RA, normal by my interpretation.    COORDINATION  OF CARE: 5:08 PM-Discussed treatment plan which includes pain medication with pt at bedside and pt agreed to plan.   Labs Review Labs Reviewed  I-STAT CHEM 8, ED    Imaging Review Dg Ankle Complete Left  07/29/2014   CLINICAL DATA:  Left ankle pain and swelling.  No trauma.  EXAM: LEFT ANKLE COMPLETE - 3+ VIEW  COMPARISON:  None.  FINDINGS: There is no evidence of fracture or dislocation. Ankle mortise is within normal.  IMPRESSION: No acute findings.   Electronically Signed   By: Elberta Fortis M.D.   On: 07/29/2014 18:04   Dg Foot  Complete Left  07/29/2014   CLINICAL DATA:  Left ankle pain and swelling.  No trauma.  EXAM: LEFT FOOT - COMPLETE 3+ VIEW  COMPARISON:  None.  FINDINGS: There are mild degenerative changes over the first MTP joint. There is no acute fracture or dislocation.  IMPRESSION: No acute findings.   Electronically Signed   By: Elberta Fortis M.D.   On: 07/29/2014 18:11     EKG Interpretation None      MDM   Final diagnoses:  Acute gout of left ankle, unspecified cause   38 yo male with report of ankle pain, swelling and erythema.  Foot and ankle x-rays negative  no evidence of occult fracture or injury. Renal function unchanged from previous. Pt without known peptic ulcer disease and not receiving concurrent treatment on warfarin. Pt reports high purine diet and he is prescribed HCTZ for htn, which he takes irregularly.  Blood pressure high in the ED but without chest pain or shortness of breath. Home meds not prescribed here because of listed allergy to one of his meds (amlodipine).  Pain improved in the ED. Discharge instructions include prescription for indomethacin (50 mg PO TID) and strict instructions to follow-up with his PCP tomorrow to discuss bp mgmt and follow-up of gout symptoms and treatment.  Pt well-appearing, afebrile and in no acute distress. Pt verbalizes understanding and in agreement.  Return precautions provided.     I personally performed the services described in this documentation, which was scribed in my presence. The recorded information has been reviewed and is accurate.  Filed Vitals:   07/29/14 1627 07/29/14 2013 07/29/14 2022  BP: 168/103 180/134 178/106  Pulse: 97 88   Temp: 98.5 F (36.9 C) 97.6 F (36.4 C)   TempSrc:  Oral   Resp: 18 16   Weight: 281 lb 3 oz (127.546 kg)    SpO2: 97% 100%    Meds given in ED:  Medications  HYDROcodone-acetaminophen (NORCO/VICODIN) 5-325 MG per tablet 2 tablet (2 tablets Oral Given 07/29/14 1723)    Discharge Medication List  as of 07/29/2014  8:03 PM    START taking these medications   Details  indomethacin (INDOCIN) 25 MG capsule Take 2 capsules (50 mg total) by mouth 3 (three) times daily as needed., Starting 07/29/2014, Until Discontinued, Print           Hector Battiest, NP 08/03/14 1244

## 2014-07-29 NOTE — Discharge Instructions (Signed)
Please follow the directions provided.  Be sure to follow up with your primary care provider as soon as possible to manage this pain.    SEEK MEDICAL CARE IF:  You develop diarrhea, vomiting, or any side effects from medicines.  You do not feel better in 24 hours, or you are getting worse. SEEK IMMEDIATE MEDICAL CARE IF:  Your joint becomes suddenly more tender, and you have chills or a fever.

## 2014-07-29 NOTE — ED Notes (Signed)
Patients BP was rechecked with a new BP cuff.

## 2014-07-29 NOTE — ED Notes (Signed)
Pt here for pain to left ankle and swelling. Thinks he has gout but no one has dx him. Pt has not an injury to ankle. Has been hurting since Saturday.

## 2014-07-29 NOTE — ED Notes (Signed)
Patient discharged with all personal belongings. 

## 2014-08-05 NOTE — ED Provider Notes (Signed)
Medical screening examination/treatment/procedure(s) were performed by non-physician practitioner and as supervising physician I was immediately available for consultation/collaboration.   EKG Interpretation None       Hurman HornJohn M Bailee Thall, MD 08/05/14 [phone]

## 2014-10-30 ENCOUNTER — Encounter (HOSPITAL_COMMUNITY): Payer: Self-pay | Admitting: *Deleted

## 2014-10-30 ENCOUNTER — Emergency Department (HOSPITAL_COMMUNITY): Payer: Self-pay

## 2014-10-30 ENCOUNTER — Emergency Department (HOSPITAL_COMMUNITY)
Admission: EM | Admit: 2014-10-30 | Discharge: 2014-10-30 | Disposition: A | Discharge status: Home / Self Care | Payer: Self-pay | Attending: Emergency Medicine | Admitting: Emergency Medicine

## 2014-10-30 DIAGNOSIS — J3489 Other specified disorders of nose and nasal sinuses: Secondary | ICD-10-CM | POA: Insufficient documentation

## 2014-10-30 DIAGNOSIS — N189 Chronic kidney disease, unspecified: Secondary | ICD-10-CM | POA: Insufficient documentation

## 2014-10-30 DIAGNOSIS — Z880 Allergy status to penicillin: Secondary | ICD-10-CM | POA: Insufficient documentation

## 2014-10-30 DIAGNOSIS — I129 Hypertensive chronic kidney disease with stage 1 through stage 4 chronic kidney disease, or unspecified chronic kidney disease: Secondary | ICD-10-CM | POA: Insufficient documentation

## 2014-10-30 DIAGNOSIS — I10 Essential (primary) hypertension: Secondary | ICD-10-CM

## 2014-10-30 DIAGNOSIS — Z79899 Other long term (current) drug therapy: Secondary | ICD-10-CM | POA: Insufficient documentation

## 2014-10-30 DIAGNOSIS — Z87891 Personal history of nicotine dependence: Secondary | ICD-10-CM | POA: Insufficient documentation

## 2014-10-30 DIAGNOSIS — R519 Headache, unspecified: Secondary | ICD-10-CM

## 2014-10-30 DIAGNOSIS — R51 Headache: Secondary | ICD-10-CM | POA: Insufficient documentation

## 2014-10-30 DIAGNOSIS — R6884 Jaw pain: Secondary | ICD-10-CM | POA: Insufficient documentation

## 2014-10-30 DIAGNOSIS — H9202 Otalgia, left ear: Secondary | ICD-10-CM | POA: Insufficient documentation

## 2014-10-30 HISTORY — DX: Migraine, unspecified, not intractable, without status migrainosus: G43.909

## 2014-10-30 LAB — CBC WITH DIFFERENTIAL/PLATELET
BASOS PCT: 0 % (ref 0–1)
Basophils Absolute: 0 10*3/uL (ref 0.0–0.1)
EOS PCT: 1 % (ref 0–5)
Eosinophils Absolute: 0.1 10*3/uL (ref 0.0–0.7)
HCT: 42 % (ref 39.0–52.0)
Hemoglobin: 14.1 g/dL (ref 13.0–17.0)
Lymphocytes Relative: 23 % (ref 12–46)
Lymphs Abs: 2.3 10*3/uL (ref 0.7–4.0)
MCH: 27.8 pg (ref 26.0–34.0)
MCHC: 33.6 g/dL (ref 30.0–36.0)
MCV: 82.7 fL (ref 78.0–100.0)
MONO ABS: 0.4 10*3/uL (ref 0.1–1.0)
Monocytes Relative: 4 % (ref 3–12)
NEUTROS PCT: 72 % (ref 43–77)
Neutro Abs: 6.9 10*3/uL (ref 1.7–7.7)
Platelets: 269 10*3/uL (ref 150–400)
RBC: 5.08 MIL/uL (ref 4.22–5.81)
RDW: 13.9 % (ref 11.5–15.5)
WBC: 9.8 10*3/uL (ref 4.0–10.5)

## 2014-10-30 LAB — BASIC METABOLIC PANEL
Anion gap: 11 (ref 5–15)
BUN: 19 mg/dL (ref 6–23)
CHLORIDE: 95 meq/L — AB (ref 96–112)
CO2: 26 mmol/L (ref 19–32)
Calcium: 10 mg/dL (ref 8.4–10.5)
Creatinine, Ser: 1.83 mg/dL — ABNORMAL HIGH (ref 0.50–1.35)
GFR calc non Af Amer: 46 mL/min — ABNORMAL LOW (ref >90)
GFR, EST AFRICAN AMERICAN: 53 mL/min — AB (ref >90)
Glucose, Bld: 91 mg/dL (ref 70–99)
Potassium: 6.7 mmol/L (ref 3.5–5.1)
Sodium: 132 mmol/L — ABNORMAL LOW (ref 135–145)

## 2014-10-30 LAB — TROPONIN I
Troponin I: <0.03 ng/mL (ref <0.031)
Troponin I: <0.03 ng/mL (ref <0.031)

## 2014-10-30 LAB — POTASSIUM: Potassium: 3.6 mmol/L (ref 3.5–5.1)

## 2014-10-30 MED ORDER — CLONIDINE HCL 0.1 MG PO TABS
0.1000 mg | ORAL_TABLET | Freq: Once | ORAL | Status: AC
  Administered 2014-10-30: 0.1 mg via ORAL
  Filled 2014-10-30: qty 1

## 2014-10-30 MED ORDER — PROMETHAZINE HCL 25 MG PO TABS: 25.0000 mg | ORAL_TABLET | Freq: Four times a day (QID) | ORAL | Status: AC | PRN

## 2014-10-30 MED ORDER — METOCLOPRAMIDE HCL 5 MG/ML IJ SOLN
10.0000 mg | Freq: Once | INTRAMUSCULAR | Status: AC
  Administered 2014-10-30: 10 mg via INTRAVENOUS
  Filled 2014-10-30: qty 2

## 2014-10-30 MED ORDER — DIPHENHYDRAMINE HCL 50 MG/ML IJ SOLN
25.0000 mg | Freq: Once | INTRAMUSCULAR | Status: AC
  Administered 2014-10-30: 25 mg via INTRAVENOUS
  Filled 2014-10-30: qty 1

## 2014-10-30 MED ORDER — SODIUM CHLORIDE 0.9 % IV BOLUS (SEPSIS)
1000.0000 mL | Freq: Once | INTRAVENOUS | Status: AC
  Administered 2014-10-30: 1000 mL via INTRAVENOUS

## 2014-10-30 NOTE — ED Notes (Signed)
EDP at bedside  

## 2014-10-30 NOTE — ED Provider Notes (Signed)
CSN: [id number]     Arrival date & time 10/30/14  1608 History   First MD Initiated Contact with Patient 10/30/14 1828     Chief Complaint  Patient presents with  . Headache      Patient is a 38 y.o. male presenting with headaches. The history is provided by the patient. No language interpreter was used.  Headache  Mr. Greff presents for evaluation of headache. Reports these had a right-sided headache for the last 3 days. The pain is predominantly over his right temple but is also located behind his eye and posterior head. Headache is worse with moving his neck and blowing his nose. He does have a little bit of rhinorrhea. He has pain in his right ear and right upper jaw. He had a URI a week ago, from which she feels like he has recovered. He denies any fevers, vomiting, vision changes. He has a history of headache associated with high blood pressure prior to starting his blood pressure medicine. He states he is taking one of his blood pressure medications, but he has been out of the other one for 3 months and he does not know the name of the medication. He took his blood pressure medication today. Symptoms are moderate, constant.  Past Medical History  Diagnosis Date  . Hypertension   . Migraine    History reviewed. No pertinent past surgical history. History reviewed. No pertinent family history. History  Substance Use Topics  . Smoking status: Former Smoker -- 0.50 packs/day    Types: Cigarettes    Quit date: 12/31/2011  . Smokeless tobacco: Not on file  . Alcohol Use: No    Review of Systems  Neurological: Positive for headaches.  All other systems reviewed and are negative.     Allergies  Amlodipine and Penicillins  Home Medications   Prior to Admission medications   Medication Sig Start Date End Date Taking? Authorizing Provider  hydrochlorothiazide (HYDRODIURIL) 12.5 MG tablet Take 1 tablet (12.5 mg total) by mouth daily. 03/05/14   Renne CriglerJoshua Geiple, PA-C   HYDROcodone-acetaminophen (NORCO/VICODIN) 5-325 MG per tablet Take 1 tablet by mouth every 4 (four) hours as needed for moderate pain or severe pain. 07/29/14   Harle BattiestElizabeth Tysinger, NP  ibuprofen (ADVIL,MOTRIN) 200 MG tablet Take 200 mg by mouth 2 (two) times daily as needed for moderate pain.    Historical Provider, MD  indomethacin (INDOCIN) 25 MG capsule Take 2 capsules (50 mg total) by mouth 3 (three) times daily as needed. 07/29/14   Harle BattiestElizabeth Tysinger, NP  Olmesartan-Amlodipine-HCTZ (TRIBENZOR) 40-5-12.5 MG TABS Take 1 tablet by mouth daily.    Historical Provider, MD   BP 200/131 mmHg  Pulse 82  Temp(Src) 98.4 F (36.9 C) (Oral)  Resp 18  SpO2 98% Physical Exam  Constitutional: He is oriented to person, place, and time. He appears well-developed and well-nourished.  HENT:  Head: Normocephalic and atraumatic.  Right Ear: External ear normal.  Left Ear: External ear normal.  Mouth/Throat: Oropharynx is clear and moist.  Eyes: Pupils are equal, round, and reactive to light.  Neck: Neck supple.  Cardiovascular: Normal rate and regular rhythm.   No murmur heard. Pulmonary/Chest: Effort normal and breath sounds normal. No respiratory distress.  Abdominal: Soft. There is no tenderness. There is no rebound and no guarding.  Musculoskeletal: He exhibits no edema or tenderness.  Neurological: He is alert and oriented to person, place, and time.  Skin: Skin is warm and dry.  Psychiatric: He has a normal  mood and affect. His behavior is normal.  Nursing note and vitals reviewed.   ED Course  Procedures (including critical care time) Labs Review Labs Reviewed  BASIC METABOLIC PANEL - Abnormal; Notable for the following:    Sodium 132 (*)    Potassium 6.7 (*)    Chloride 95 (*)    Creatinine, Ser 1.83 (*)    GFR calc non Af Amer 46 (*)    GFR calc Af Amer 53 (*)    All other components within normal limits  CBC WITH DIFFERENTIAL  TROPONIN I  POTASSIUM  TROPONIN I  CBC WITH  DIFFERENTIAL    Imaging Review Ct Head Wo Contrast  10/30/2014   CLINICAL DATA:  Headache and nausea.  History of migraines.  EXAM: CT HEAD WITHOUT CONTRAST  TECHNIQUE: Contiguous axial images were obtained from the base of the skull through the vertex without intravenous contrast.  COMPARISON:  Prior study from 08/06/2012  FINDINGS: There is no acute intracranial hemorrhage or infarct. No mass lesion or midline shift. Gray-white matter differentiation is well maintained. Ventricles are normal in size without evidence of hydrocephalus. CSF containing spaces are within normal limits. No extra-axial fluid collection.  The calvarium is intact.  Orbital soft tissues are within normal limits.  Mild mucosal thickening present within the maxillary sinuses bilaterally as well as the ethmoidal air cells. No air-fluid levels within the paranasal sinuses. Mastoid air cells are clear.  Scalp soft tissues are unremarkable.  IMPRESSION: Normal head CT with no acute intracranial abnormality identified.   Electronically Signed   By: Rise MuBenjamin  McClintock M.D.   On: 10/30/2014 20:51     EKG Interpretation   Date/Time:  Wednesday October 30 2014 19:46:34 EST Ventricular Rate:  90 PR Interval:  176 QRS Duration: 70 QT Interval:  346 QTC Calculation: 423 R Axis:   45 Text Interpretation:  Sinus rhythm q waves in V1, V2 Nonspecific repol  abnormality, inferior leads Confirmed by Lincoln Brighamees, Liz [phone]([id number]) on 10/30/2014  8:44:09 PM      MDM   Final diagnoses:  Nonintractable headache, unspecified chronicity pattern, unspecified headache type  Essential hypertension  Chronic kidney disease, unspecified stage   patient here for evaluation of headache, headache improved after headache cocktail emergency department. Patient is noted to be hypertensive in the emergency department, his blood pressure did improve with headache control and that his clonidine. Clinical picture is not consistent with hypertensive urgency,  meningitis, subarachnoid hemorrhage. Initial BMP with hyperkalemia, recollect with normal potassium, hyperkalemia likely secondary to hemolysis. Discussed with patient hypertension and blood pressure checking at home as well as PCP follow-up. Also discussed with patient chronic kidney disease.  Tilden FossaElizabeth Kaysha Parsell, MD 10/30/14 2250

## 2014-10-30 NOTE — ED Notes (Addendum)
Pt reports having a headache x 3 days with nausea. Denies vomiting, no relief with otc meds, reports hx of migraines. Hx of htn, bp is 191/130 at triage, reports being out of one of his bp meds x 3 months.

## 2014-10-30 NOTE — Discharge Instructions (Signed)
You have chronic kidney problems.  Do no take ibuprofen or aleve.     Chronic Kidney Disease Chronic kidney disease occurs when the kidneys are damaged over a long period. The kidneys are two organs that lie on either side of the spine between the middle of the back and the front of the abdomen. The kidneys:   Remove wastes and extra water from the blood.   Produce important hormones. These help keep bones strong, regulate blood pressure, and help create red blood cells.   Balance the fluids and chemicals in the blood and tissues. A small amount of kidney damage may not cause problems, but a large amount of damage may make it difficult or impossible for the kidneys to work the way they should. If steps are not taken to slow down the kidney damage or stop it from getting worse, the kidneys may stop working permanently. Most of the time, chronic kidney disease does not go away. However, it can often be controlled, and those with the disease can usually live normal lives. CAUSES  The most common causes of chronic kidney disease are diabetes and high blood pressure (hypertension). Chronic kidney disease may also be caused by:   Diseases that cause the kidneys' filters to become inflamed.   Diseases that affect the immune system.   Genetic diseases.   Medicines that damage the kidneys, such as anti-inflammatory medicines.  Poisoning or exposure to toxic substances.   A reoccurring kidney or urinary infection.   A problem with urine flow. This may be caused by:   Cancer.   Kidney stones.   An enlarged prostate in males. SIGNS AND SYMPTOMS  Because the kidney damage in chronic kidney disease occurs slowly, symptoms develop slowly and may not be obvious until the kidney damage becomes severe. A person may have a kidney disease for years without showing any symptoms. Symptoms can include:   Swelling (edema) of the legs, ankles, or feet.   Tiredness (lethargy).   Nausea  or vomiting.   Confusion.   Problems with urination, such as:   Decreased urine production.   Frequent urination, especially at night.   Frequent accidents in children who are potty trained.   Muscle twitches and cramps.   Shortness of breath.  Weakness.   Persistent itchiness.   Loss of appetite.  Metallic taste in the mouth.  Trouble sleeping.  Slowed development in children.  Short stature in children. DIAGNOSIS  Chronic kidney disease may be detected and diagnosed by tests, including blood, urine, imaging, or kidney biopsy tests.  TREATMENT  Most chronic kidney diseases cannot be cured. Treatment usually involves relieving symptoms and preventing or slowing the progression of the disease. Treatment may include:   A special diet. You may need to avoid alcohol and foods thatare salty and high in potassium.   Medicines. These may:   Lower blood pressure.   Relieve anemia.   Relieve swelling.   Protect the bones. HOME CARE INSTRUCTIONS   Follow your prescribed diet.   Take medicines only as directed by your health care provider. Do not take any new medicines (prescription, over-the-counter, or nutritional supplements) unless approved by your health care provider. Many medicines can worsen your kidney damage or need to have the dose adjusted.   Quit smoking if you smoke. Talk to your health care provider about a smoking cessation program.   Keep all follow-up visits as directed by your health care provider. SEEK IMMEDIATE MEDICAL CARE IF:  Your symptoms get  worse or you develop new symptoms.   You develop symptoms of end-stage kidney disease. These include:   Headaches.   Abnormally dark or light skin.   Numbness in the hands or feet.   Easy bruising.   Frequent hiccups.   Menstruation stops.   You have a fever.   You have decreased urine production.   You havepain or bleeding when urinating. MAKE SURE  YOU:  Understand these instructions.  Will watch your condition.  Will get help right away if you are not doing well or get worse. FOR MORE INFORMATION   American Association of Kidney Patients: ResidentialShow.is  National Kidney Foundation: www.kidney.org  American Kidney Fund: FightingMatch.com.ee  [facility]: www.lifeoptions.org and www.kidneyschool.org Document Released: 07/27/2008 Document Revised: 03/04/2014 Document Reviewed: 06/16/2012 [facility] Patient Information 2015 [place], [place]. This information is not intended to replace advice given to you by your health care provider. Make sure you discuss any questions you have with your health care provider.  Headaches, Frequently Asked Questions MIGRAINE HEADACHES Q: What is migraine? What causes it? How can I treat it? A: Generally, migraine headaches begin as a dull ache. Then they develop into a constant, throbbing, and pulsating pain. You may experience pain at the temples. You may experience pain at the front or back of one or both sides of the head. The pain is usually accompanied by a combination of:  Nausea.  Vomiting.  Sensitivity to light and noise. Some people (about 15%) experience an aura (see below) before an attack. The cause of migraine is believed to be chemical reactions in the brain. Treatment for migraine may include over-the-counter or prescription medications. It may also include self-help techniques. These include relaxation training and biofeedback.  Q: What is an aura? A: About 15% of people with migraine get an "aura". This is a sign of neurological symptoms that occur before a migraine headache. You may see wavy or jagged lines, dots, or flashing lights. You might experience tunnel vision or blind spots in one or both eyes. The aura can include visual or auditory hallucinations (something imagined). It may include disruptions in smell (such as strange odors), taste or touch. Other symptoms  include:  Numbness.  A "pins and needles" sensation.  Difficulty in recalling or speaking the correct word. These neurological events may last as long as 60 minutes. These symptoms will fade as the headache begins. Q: What is a trigger? A: Certain physical or environmental factors can lead to or "trigger" a migraine. These include:  Foods.  Hormonal changes.  Weather.  Stress. It is important to remember that triggers are different for everyone. To help prevent migraine attacks, you need to figure out which triggers affect you. Keep a headache diary. This is a good way to track triggers. The diary will help you talk to your healthcare professional about your condition. Q: Does weather affect migraines? A: Bright sunshine, hot, humid conditions, and drastic changes in barometric pressure may lead to, or "trigger," a migraine attack in some people. But studies have shown that weather does not act as a trigger for everyone with migraines. Q: What is the link between migraine and hormones? A: Hormones start and regulate many of your body's functions. Hormones keep your body in balance within a constantly changing environment. The levels of hormones in your body are unbalanced at times. Examples are during menstruation, pregnancy, or menopause. That can lead to a migraine attack. In fact, about three quarters of all women with migraine report  that their attacks are related to the menstrual cycle.  Q: Is there an increased risk of stroke for migraine sufferers? A: The likelihood of a migraine attack causing a stroke is very remote. That is not to say that migraine sufferers cannot have a stroke associated with their migraines. In persons under age 14, the most common associated factor for stroke is migraine headache. But over the course of a person's normal life span, the occurrence of migraine headache may actually be associated with a reduced risk of dying from cerebrovascular disease due to  stroke.  Q: What are acute medications for migraine? A: Acute medications are used to treat the pain of the headache after it has started. Examples over-the-counter medications, NSAIDs, ergots, and triptans.  Q: What are the triptans? A: Triptans are the newest class of abortive medications. They are specifically targeted to treat migraine. Triptans are vasoconstrictors. They moderate some chemical reactions in the brain. The triptans work on receptors in your brain. Triptans help to restore the balance of a neurotransmitter called serotonin. Fluctuations in levels of serotonin are thought to be a main cause of migraine.  Q: Are over-the-counter medications for migraine effective? A: Over-the-counter, or "OTC," medications may be effective in relieving mild to moderate pain and associated symptoms of migraine. But you should see your caregiver before beginning any treatment regimen for migraine.  Q: What are preventive medications for migraine? A: Preventive medications for migraine are sometimes referred to as "prophylactic" treatments. They are used to reduce the frequency, severity, and length of migraine attacks. Examples of preventive medications include antiepileptic medications, antidepressants, beta-blockers, calcium channel blockers, and NSAIDs (nonsteroidal anti-inflammatory drugs). Q: Why are anticonvulsants used to treat migraine? A: During the past few years, there has been an increased interest in antiepileptic drugs for the prevention of migraine. They are sometimes referred to as "anticonvulsants". Both epilepsy and migraine may be caused by similar reactions in the brain.  Q: Why are antidepressants used to treat migraine? A: Antidepressants are typically used to treat people with depression. They may reduce migraine frequency by regulating chemical levels, such as serotonin, in the brain.  Q: What alternative therapies are used to treat migraine? A: The term "alternative therapies" is  often used to describe treatments considered outside the scope of conventional Western medicine. Examples of alternative therapy include acupuncture, acupressure, and yoga. Another common alternative treatment is herbal therapy. Some herbs are believed to relieve headache pain. Always discuss alternative therapies with your caregiver before proceeding. Some herbal products contain arsenic and other toxins. TENSION HEADACHES Q: What is a tension-type headache? What causes it? How can I treat it? A: Tension-type headaches occur randomly. They are often the result of temporary stress, anxiety, fatigue, or anger. Symptoms include soreness in your temples, a tightening band-like sensation around your head (a "vice-like" ache). Symptoms can also include a pulling feeling, pressure sensations, and contracting head and neck muscles. The headache begins in your forehead, temples, or the back of your head and neck. Treatment for tension-type headache may include over-the-counter or prescription medications. Treatment may also include self-help techniques such as relaxation training and biofeedback. CLUSTER HEADACHES Q: What is a cluster headache? What causes it? How can I treat it? A: Cluster headache gets its name because the attacks come in groups. The pain arrives with little, if any, warning. It is usually on one side of the head. A tearing or bloodshot eye and a runny nose on the same side of the headache may  also accompany the pain. Cluster headaches are believed to be caused by chemical reactions in the brain. They have been described as the most severe and intense of any headache type. Treatment for cluster headache includes prescription medication and oxygen. SINUS HEADACHES Q: What is a sinus headache? What causes it? How can I treat it? A: When a cavity in the bones of the face and skull (a sinus) becomes inflamed, the inflammation will cause localized pain. This condition is usually the result of an  allergic reaction, a tumor, or an infection. If your headache is caused by a sinus blockage, such as an infection, you will probably have a fever. An x-ray will confirm a sinus blockage. Your caregiver's treatment might include antibiotics for the infection, as well as antihistamines or decongestants.  REBOUND HEADACHES Q: What is a rebound headache? What causes it? How can I treat it? A: A pattern of taking acute headache medications too often can lead to a condition known as "rebound headache." A pattern of taking too much headache medication includes taking it more than 2 days per week or in excessive amounts. That means more than the label or a caregiver advises. With rebound headaches, your medications not only stop relieving pain, they actually begin to cause headaches. Doctors treat rebound headache by tapering the medication that is being overused. Sometimes your caregiver will gradually substitute a different type of treatment or medication. Stopping may be a challenge. Regularly overusing a medication increases the potential for serious side effects. Consult a caregiver if you regularly use headache medications more than 2 days per week or more than the label advises. ADDITIONAL QUESTIONS AND ANSWERS Q: What is biofeedback? A: Biofeedback is a self-help treatment. Biofeedback uses special equipment to monitor your body's involuntary physical responses. Biofeedback monitors:  Breathing.  Pulse.  Heart rate.  Temperature.  Muscle tension.  Brain activity. Biofeedback helps you refine and perfect your relaxation exercises. You learn to control the physical responses that are related to stress. Once the technique has been mastered, you do not need the equipment any more. Q: Are headaches hereditary? A: Four out of five (80%) of people that suffer report a family history of migraine. Scientists are not sure if this is genetic or a family predisposition. Despite the uncertainty, a child has  a 50% chance of having migraine if one parent suffers. The child has a 75% chance if both parents suffer.  Q: Can children get headaches? A: By the time they reach high school, most young people have experienced some type of headache. Many safe and effective approaches or medications can prevent a headache from occurring or stop it after it has begun.  Q: What type of doctor should I see to diagnose and treat my headache? A: Start with your primary caregiver. Discuss his or her experience and approach to headaches. Discuss methods of classification, diagnosis, and treatment. Your caregiver may decide to recommend you to a headache specialist, depending upon your symptoms or other physical conditions. Having diabetes, allergies, etc., may require a more comprehensive and inclusive approach to your headache. The National Headache Foundation will provide, upon request, a list of [facility] physician members in your state. Document Released: 01/08/2004 Document Revised: 01/10/2012 Document Reviewed: 06/17/2008 [facility] Patient Information 2015 [place], [place]. This information is not intended to replace advice given to you by your health care provider. Make sure you discuss any questions you have with your health care provider.  Hypertension Hypertension, commonly called high blood pressure, is  when the force of blood pumping through your arteries is too strong. Your arteries are the blood vessels that carry blood from your heart throughout your body. A blood pressure reading consists of a higher number over a lower number, such as 110/72. The higher number (systolic) is the pressure inside your arteries when your heart pumps. The lower number (diastolic) is the pressure inside your arteries when your heart relaxes. Ideally you want your blood pressure below 120/80. Hypertension forces your heart to work harder to pump blood. Your arteries may become narrow or stiff. Having hypertension puts you at risk for heart  disease, stroke, and other problems.  RISK FACTORS Some risk factors for high blood pressure are controllable. Others are not.  Risk factors you cannot control include:   Race. You may be at higher risk if you are African American.  Age. Risk increases with age.  Gender. Men are at higher risk than women before age 37 years. After age 37, women are at higher risk than men. Risk factors you can control include:  Not getting enough exercise or physical activity.  Being overweight.  Getting too much fat, sugar, calories, or salt in your diet.  Drinking too much alcohol. SIGNS AND SYMPTOMS Hypertension does not usually cause signs or symptoms. Extremely high blood pressure (hypertensive crisis) may cause headache, anxiety, shortness of breath, and nosebleed. DIAGNOSIS  To check if you have hypertension, your health care provider will measure your blood pressure while you are seated, with your arm held at the level of your heart. It should be measured at least twice using the same arm. Certain conditions can cause a difference in blood pressure between your right and left arms. A blood pressure reading that is higher than normal on one occasion does not mean that you need treatment. If one blood pressure reading is high, ask your health care provider about having it checked again. TREATMENT  Treating high blood pressure includes making lifestyle changes and possibly taking medicine. Living a healthy lifestyle can help lower high blood pressure. You may need to change some of your habits. Lifestyle changes may include:  Following the DASH diet. This diet is high in fruits, vegetables, and whole grains. It is low in salt, red meat, and added sugars.  Getting at least 2 hours of brisk physical activity every week.  Losing weight if necessary.  Not smoking.  Limiting alcoholic beverages.  Learning ways to reduce stress. If lifestyle changes are not enough to get your blood pressure  under control, your health care provider may prescribe medicine. You may need to take more than one. Work closely with your health care provider to understand the risks and benefits. HOME CARE INSTRUCTIONS  Have your blood pressure rechecked as directed by your health care provider.   Take medicines only as directed by your health care provider. Follow the directions carefully. Blood pressure medicines must be taken as prescribed. The medicine does not work as well when you skip doses. Skipping doses also puts you at risk for problems.   Do not smoke.   Monitor your blood pressure at home as directed by your health care provider. SEEK MEDICAL CARE IF:   You think you are having a reaction to medicines taken.  You have recurrent headaches or feel dizzy.  You have swelling in your ankles.  You have trouble with your vision. SEEK IMMEDIATE MEDICAL CARE IF:  You develop a severe headache or confusion.  You have unusual weakness, numbness, or  feel faint.  You have severe chest or abdominal pain.  You vomit repeatedly.  You have trouble breathing. MAKE SURE YOU:   Understand these instructions.  Will watch your condition.  Will get help right away if you are not doing well or get worse. Document Released: 10/18/2005 Document Revised: 03/04/2014 Document Reviewed: 08/10/2013 [facility] Patient Information 2015 [place], [place]. This information is not intended to replace advice given to you by your health care provider. Make sure you discuss any questions you have with your health care provider.

## 2014-10-30 NOTE — ED Notes (Signed)
Critical lab of K+6.7  reported to Dr Madilyn Hookees

## 2015-02-08 ENCOUNTER — Emergency Department (HOSPITAL_COMMUNITY)
Admission: EM | Admit: 2015-02-08 | Discharge: 2015-02-08 | Disposition: A | Discharge status: Home / Self Care | Payer: Self-pay | Attending: Emergency Medicine | Admitting: Emergency Medicine

## 2015-02-08 ENCOUNTER — Encounter (HOSPITAL_COMMUNITY): Payer: Self-pay | Admitting: Emergency Medicine

## 2015-02-08 ENCOUNTER — Emergency Department (HOSPITAL_COMMUNITY): Payer: Self-pay

## 2015-02-08 DIAGNOSIS — I169 Hypertensive crisis, unspecified: Secondary | ICD-10-CM

## 2015-02-08 DIAGNOSIS — M542 Cervicalgia: Secondary | ICD-10-CM | POA: Insufficient documentation

## 2015-02-08 DIAGNOSIS — Z87891 Personal history of nicotine dependence: Secondary | ICD-10-CM | POA: Insufficient documentation

## 2015-02-08 DIAGNOSIS — Z79899 Other long term (current) drug therapy: Secondary | ICD-10-CM | POA: Insufficient documentation

## 2015-02-08 DIAGNOSIS — Z8679 Personal history of other diseases of the circulatory system: Secondary | ICD-10-CM | POA: Insufficient documentation

## 2015-02-08 DIAGNOSIS — I10 Essential (primary) hypertension: Secondary | ICD-10-CM | POA: Insufficient documentation

## 2015-02-08 DIAGNOSIS — R51 Headache: Secondary | ICD-10-CM | POA: Insufficient documentation

## 2015-02-08 LAB — CBC WITH DIFFERENTIAL/PLATELET
Basophils Absolute: 0 10*3/uL (ref 0.0–0.1)
Basophils Relative: 0 % (ref 0–1)
EOS ABS: 0.1 10*3/uL (ref 0.0–0.7)
EOS PCT: 1 % (ref 0–5)
HCT: 46.1 % (ref 39.0–52.0)
Hemoglobin: 15.9 g/dL (ref 13.0–17.0)
LYMPHS PCT: 23 % (ref 12–46)
Lymphs Abs: 2.7 10*3/uL (ref 0.7–4.0)
MCH: 29 pg (ref 26.0–34.0)
MCHC: 34.5 g/dL (ref 30.0–36.0)
MCV: 84 fL (ref 78.0–100.0)
Monocytes Absolute: 0.5 10*3/uL (ref 0.1–1.0)
Monocytes Relative: 4 % (ref 3–12)
NEUTROS ABS: 8.3 10*3/uL — AB (ref 1.7–7.7)
Neutrophils Relative %: 72 % (ref 43–77)
PLATELETS: 296 10*3/uL (ref 150–400)
RBC: 5.49 MIL/uL (ref 4.22–5.81)
RDW: 14.1 % (ref 11.5–15.5)
WBC: 11.6 10*3/uL — AB (ref 4.0–10.5)

## 2015-02-08 LAB — URINALYSIS, ROUTINE W REFLEX MICROSCOPIC
BILIRUBIN URINE: NEGATIVE
GLUCOSE, UA: NEGATIVE mg/dL
Hgb urine dipstick: NEGATIVE
KETONES UR: NEGATIVE mg/dL
NITRITE: NEGATIVE
PH: 5 (ref 5.0–8.0)
Protein, ur: NEGATIVE mg/dL
Specific Gravity, Urine: 1.013 (ref 1.005–1.030)
Urobilinogen, UA: 0.2 mg/dL (ref 0.0–1.0)

## 2015-02-08 LAB — BASIC METABOLIC PANEL
Anion gap: 10 (ref 5–15)
BUN: 25 mg/dL — ABNORMAL HIGH (ref 6–23)
CO2: 24 mmol/L (ref 19–32)
Calcium: 9.4 mg/dL (ref 8.4–10.5)
Chloride: 99 mmol/L (ref 96–112)
Creatinine, Ser: 2.32 mg/dL — ABNORMAL HIGH (ref 0.50–1.35)
GFR calc Af Amer: 40 mL/min — ABNORMAL LOW (ref >90)
GFR calc non Af Amer: 34 mL/min — ABNORMAL LOW (ref >90)
GLUCOSE: 106 mg/dL — AB (ref 70–99)
Potassium: 4.2 mmol/L (ref 3.5–5.1)
Sodium: 133 mmol/L — ABNORMAL LOW (ref 135–145)

## 2015-02-08 LAB — URINE MICROSCOPIC-ADD ON

## 2015-02-08 LAB — TROPONIN I

## 2015-02-08 MED ORDER — LABETALOL HCL 5 MG/ML IV SOLN
20.0000 mg | Freq: Once | INTRAVENOUS | Status: AC
  Administered 2015-02-08: 20 mg via INTRAVENOUS
  Filled 2015-02-08: qty 4

## 2015-02-08 MED ORDER — HYDROCHLOROTHIAZIDE 25 MG PO TABS: 25.0000 mg | ORAL_TABLET | Freq: Every day | ORAL | Status: DC

## 2015-02-08 MED ORDER — LOSARTAN POTASSIUM 50 MG PO TABS: 50.0000 mg | ORAL_TABLET | Freq: Every day | ORAL | Status: DC

## 2015-02-08 MED ORDER — SODIUM CHLORIDE 0.9 % IV BOLUS (SEPSIS)
1000.0000 mL | Freq: Once | INTRAVENOUS | Status: AC
  Administered 2015-02-08: 1000 mL via INTRAVENOUS

## 2015-02-08 MED ORDER — KETOROLAC TROMETHAMINE 30 MG/ML IJ SOLN
30.0000 mg | Freq: Once | INTRAMUSCULAR | Status: AC
  Administered 2015-02-08: 30 mg via INTRAVENOUS
  Filled 2015-02-08: qty 1

## 2015-02-08 MED ORDER — HYDRALAZINE HCL 20 MG/ML IJ SOLN
10.0000 mg | INTRAMUSCULAR | Status: AC
  Administered 2015-02-08: 10 mg via INTRAVENOUS
  Filled 2015-02-08: qty 1

## 2015-02-08 NOTE — Discharge Instructions (Signed)
As discussed, it is important that you follow up as soon as possible with your physician for continued management of your condition. ° °If you develop any new, or concerning changes in your condition, please return to the emergency department immediately. ° °

## 2015-02-08 NOTE — ED Notes (Signed)
Pt reports due to insurance changes he has been unable to take his BP medication for two months, also reports a neck pain posterior radiating up to his head, headache for a while now. New onset chest pain this morning to left chest with shortness of breathe and nausea. BP 200s/100s.

## 2015-02-08 NOTE — ED Provider Notes (Signed)
CSN: [id number]     Arrival date & time 02/08/15  1227 History   First MD Initiated Contact with Patient 02/08/15 1229     Chief Complaint  Patient presents with  . Chest Pain  . Neck Pain  . Headache    HPI  Patient presents with concern of hypertension, headache. Patient last took her blood pressure medication about 2 months ago, has had difficulty with medical access since that time. He states that over the interval 2 months he has had fatigue with exertion him a headaches, generalized discomfort, but no focal chest pain, belly pain, syncope, vomiting. No new visual changes. Today, the patient felt associated diffuse, mild headache, and with his concerns checked his blood pressure, found it to be elevated. No clear precipitating, relieving, exacerbating factors.  Past Medical History  Diagnosis Date  . Hypertension   . Migraine    History reviewed. No pertinent past surgical history. History reviewed. No pertinent family history. History  Substance Use Topics  . Smoking status: Former Smoker -- 0.50 packs/day    Types: Cigarettes    Quit date: 12/31/2011  . Smokeless tobacco: Not on file  . Alcohol Use: No    Review of Systems  Constitutional:       Per HPI, otherwise negative  HENT:       Per HPI, otherwise negative  Respiratory:       Per HPI, otherwise negative  Cardiovascular:       Per HPI, otherwise negative  Gastrointestinal: Negative for vomiting.  Endocrine:       Negative aside from HPI  Genitourinary:       Neg aside from HPI   Musculoskeletal:       Per HPI, otherwise negative  Skin: Negative.   Neurological: Positive for headaches. Negative for syncope.      Allergies  Amlodipine and Penicillins  Home Medications   Prior to Admission medications   Medication Sig Start Date End Date Taking? Authorizing Provider  hydrochlorothiazide (HYDRODIURIL) 12.5 MG tablet Take 1 tablet (12.5 mg total) by mouth daily. 03/05/14   Renne CriglerJoshua Geiple, PA-C   HYDROcodone-acetaminophen (NORCO/VICODIN) 5-325 MG per tablet Take 1 tablet by mouth every 4 (four) hours as needed for moderate pain or severe pain. 07/29/14   Harle BattiestElizabeth Tysinger, NP  ibuprofen (ADVIL,MOTRIN) 200 MG tablet Take 200 mg by mouth 2 (two) times daily as needed for moderate pain.    Historical Provider, MD  indomethacin (INDOCIN) 25 MG capsule Take 2 capsules (50 mg total) by mouth 3 (three) times daily as needed. Patient not taking: Reported on 10/30/2014 07/29/14   Harle BattiestElizabeth Tysinger, NP  Olmesartan-Amlodipine-HCTZ [facility](TRIBENZOR) 40-5-12.5 MG TABS Take 1 tablet by mouth daily.    Historical Provider, MD  promethazine (PHENERGAN) 25 MG tablet Take 1 tablet (25 mg total) by mouth every 6 (six) hours as needed for nausea or vomiting. 10/30/14   Tilden FossaElizabeth Rees, MD   BP 194/133 mmHg  Pulse 86  Resp 15  SpO2 98% Physical Exam  Constitutional: He is oriented to person, place, and time. He appears well-developed. No distress.  HENT:  Head: Normocephalic and atraumatic.  Eyes: Conjunctivae and EOM are normal.  Cardiovascular: Normal rate and regular rhythm.   Pulmonary/Chest: Effort normal. No stridor. No respiratory distress.  Abdominal: He exhibits no distension.  Musculoskeletal: He exhibits no edema.  Neurological: He is alert and oriented to person, place, and time. No cranial nerve deficit. He exhibits normal muscle tone. Coordination normal.  Skin: Skin is  warm and dry.  Psychiatric: He has a normal mood and affect.  Nursing note and vitals reviewed.   ED Course  Procedures (including critical care time) Labs Review Labs Reviewed  BASIC METABOLIC PANEL - Abnormal; Notable for the following:    Sodium 133 (*)    Glucose, Bld 106 (*)    BUN 25 (*)    Creatinine, Ser 2.32 (*)    GFR calc non Af Amer 34 (*)    GFR calc Af Amer 40 (*)    All other components within normal limits  CBC WITH DIFFERENTIAL/PLATELET - Abnormal; Notable for the following:    WBC 11.6 (*)     Neutro Abs 8.3 (*)    All other components within normal limits  URINALYSIS, ROUTINE W REFLEX MICROSCOPIC - Abnormal; Notable for the following:    Leukocytes, UA TRACE (*)    All other components within normal limits  TROPONIN I  URINE MICROSCOPIC-ADD ON    Imaging Review Dg Chest 2 View  02/08/2015   CLINICAL DATA:  Short of breath.  Chest pain  EXAM: CHEST  2 VIEW  COMPARISON:  08/27/2013  FINDINGS: The heart size and mediastinal contours are within normal limits. Both lungs are clear. The visualized skeletal structures are unremarkable.  IMPRESSION: No active cardiopulmonary disease.   Electronically Signed   By: Signa Kell M.D.   On: 02/08/2015 13:44     EKG Interpretation   Date/Time:  Saturday February 08 2015 12:36:21 EDT Ventricular Rate:  97 PR Interval:  172 QRS Duration: 74 QT Interval:  333 QTC Calculation: 423 R Axis:   9 Text Interpretation:  Sinus rhythm Probable left atrial enlargement Left  ventricular hypertrophy Anterior Q waves, possibly due to LVH Baseline  wander in lead(s) V3 Sinus rhythm Left ventricular hypertrophy Abnormal  ekg Confirmed by Gerhard Munch  MD ([id number]) on 02/08/2015 1:16:27 PM     Initial blood pressure 218/114. Patient received labetalol, hydralazine.   Cardiac: 80 sr, nml  o2- 99%ra, nml  3:45 PM Blood pressure 160/100, 25% less than on arrival. I discussed all findings with patient and his mother, specifically the persistent renal dysfunction and his hypertrophic changes on EKG. Patient was provided prescription for generic equivalence of some of his previous the prescribed medication. He will follow-up with primary care this week.    MDM   Final diagnoses:  Hypertensive crisis   Patient presents with concern of ongoing headache, generalized complaints, and hypertension. The patient is notably hypertensive, with initial blood pressure of 211/140. With labetalol, hydrochlorothiazide, patient's blood pressure decreased  appropriately, he had no new complaints, and no evidence for critical progression of his chronic kidney disease. Patient had improvement in his blood pressure, and symptoms, was discharged after initiation of new medication to follow-up with primary care.    Gerhard Munch, MD 02/08/15 [phone]

## 2015-02-08 NOTE — ED Notes (Signed)
Patient transported to X-ray 

## 2015-02-10 ENCOUNTER — Emergency Department (HOSPITAL_COMMUNITY)
Admission: EM | Admit: 2015-02-10 | Discharge: 2015-02-10 | Discharge status: Against Medical Advice | Payer: Self-pay | Attending: Emergency Medicine | Admitting: Emergency Medicine

## 2015-02-10 ENCOUNTER — Encounter (HOSPITAL_COMMUNITY): Payer: Self-pay

## 2015-02-10 DIAGNOSIS — I10 Essential (primary) hypertension: Secondary | ICD-10-CM | POA: Insufficient documentation

## 2015-02-10 LAB — BASIC METABOLIC PANEL
Anion gap: 10 (ref 5–15)
BUN: 25 mg/dL — ABNORMAL HIGH (ref 6–23)
CO2: 25 mmol/L (ref 19–32)
Calcium: 9.4 mg/dL (ref 8.4–10.5)
Chloride: 98 mmol/L (ref 96–112)
Creatinine, Ser: 2.08 mg/dL — ABNORMAL HIGH (ref 0.50–1.35)
GFR calc Af Amer: 45 mL/min — ABNORMAL LOW (ref >90)
GFR calc non Af Amer: 39 mL/min — ABNORMAL LOW (ref >90)
GLUCOSE: 114 mg/dL — AB (ref 70–99)
Potassium: 3.4 mmol/L — ABNORMAL LOW (ref 3.5–5.1)
Sodium: 133 mmol/L — ABNORMAL LOW (ref 135–145)

## 2015-02-10 LAB — CBC
HEMATOCRIT: 43.7 % (ref 39.0–52.0)
HEMOGLOBIN: 14.7 g/dL (ref 13.0–17.0)
MCH: 28.2 pg (ref 26.0–34.0)
MCHC: 33.6 g/dL (ref 30.0–36.0)
MCV: 83.9 fL (ref 78.0–100.0)
PLATELETS: 305 10*3/uL (ref 150–400)
RBC: 5.21 MIL/uL (ref 4.22–5.81)
RDW: 14.1 % (ref 11.5–15.5)
WBC: 10.9 10*3/uL — ABNORMAL HIGH (ref 4.0–10.5)

## 2015-02-10 LAB — I-STAT TROPONIN, ED: TROPONIN I, POC: 0 ng/mL (ref 0.00–0.08)

## 2015-02-10 NOTE — ED Notes (Signed)
Pt. Reports hypertension with nausea, dizziness, cp, and headache. Seen here on Saturday for same and given BP medications. States was working yesterday but today symptoms came back. BP 186-202 systolic at home.

## 2015-02-10 NOTE — ED Notes (Signed)
Pt at desk stating he is leaving due to feeling better, encouraged to stay, informed he would be roomed next, stated he didn't want to be seen anymore

## 2015-06-02 ENCOUNTER — Emergency Department (HOSPITAL_COMMUNITY)
Admission: EM | Admit: 2015-06-02 | Discharge: 2015-06-02 | Disposition: A | Discharge status: Home / Self Care | Payer: BLUE CROSS/BLUE SHIELD | Attending: Emergency Medicine | Admitting: Emergency Medicine

## 2015-06-02 ENCOUNTER — Encounter (HOSPITAL_COMMUNITY): Payer: Self-pay | Admitting: Nurse Practitioner

## 2015-06-02 DIAGNOSIS — R51 Headache: Secondary | ICD-10-CM | POA: Diagnosis not present

## 2015-06-02 DIAGNOSIS — R519 Headache, unspecified: Secondary | ICD-10-CM

## 2015-06-02 DIAGNOSIS — I10 Essential (primary) hypertension: Secondary | ICD-10-CM | POA: Diagnosis present

## 2015-06-02 DIAGNOSIS — Z880 Allergy status to penicillin: Secondary | ICD-10-CM | POA: Diagnosis not present

## 2015-06-02 DIAGNOSIS — Z8679 Personal history of other diseases of the circulatory system: Secondary | ICD-10-CM | POA: Diagnosis not present

## 2015-06-02 DIAGNOSIS — Z87891 Personal history of nicotine dependence: Secondary | ICD-10-CM | POA: Diagnosis not present

## 2015-06-02 LAB — BASIC METABOLIC PANEL
ANION GAP: 7 (ref 5–15)
BUN: 23 mg/dL — ABNORMAL HIGH (ref 6–20)
CALCIUM: 9.1 mg/dL (ref 8.9–10.3)
CHLORIDE: 102 mmol/L (ref 101–111)
CO2: 26 mmol/L (ref 22–32)
CREATININE: 2.04 mg/dL — AB (ref 0.61–1.24)
GFR calc non Af Amer: 40 mL/min — ABNORMAL LOW (ref >60)
GFR, EST AFRICAN AMERICAN: 46 mL/min — AB (ref >60)
GLUCOSE: 97 mg/dL (ref 65–99)
POTASSIUM: 3.8 mmol/L (ref 3.5–5.1)
SODIUM: 135 mmol/L (ref 135–145)

## 2015-06-02 LAB — CBC
HEMATOCRIT: 41.7 % (ref 39.0–52.0)
HEMOGLOBIN: 14.3 g/dL (ref 13.0–17.0)
MCH: 28.7 pg (ref 26.0–34.0)
MCHC: 34.3 g/dL (ref 30.0–36.0)
MCV: 83.7 fL (ref 78.0–100.0)
Platelets: 291 10*3/uL (ref 150–400)
RBC: 4.98 MIL/uL (ref 4.22–5.81)
RDW: 13.2 % (ref 11.5–15.5)
WBC: 10.9 10*3/uL — AB (ref 4.0–10.5)

## 2015-06-02 LAB — I-STAT TROPONIN, ED: Troponin i, poc: 0 ng/mL (ref 0.00–0.08)

## 2015-06-02 MED ORDER — DIPHENHYDRAMINE HCL 50 MG/ML IJ SOLN
25.0000 mg | Freq: Once | INTRAMUSCULAR | Status: AC
  Administered 2015-06-02: 25 mg via INTRAVENOUS
  Filled 2015-06-02: qty 1

## 2015-06-02 MED ORDER — LOSARTAN POTASSIUM 50 MG PO TABS: 50.0000 mg | ORAL_TABLET | Freq: Every day | ORAL | Status: AC

## 2015-06-02 MED ORDER — BUTALBITAL-APAP-CAFFEINE 50-325-40 MG PO TABS
1.0000 | ORAL_TABLET | Freq: Once | ORAL | Status: AC
  Administered 2015-06-02: 1 via ORAL
  Filled 2015-06-02: qty 1

## 2015-06-02 MED ORDER — SODIUM CHLORIDE 0.9 % IV BOLUS (SEPSIS)
1000.0000 mL | Freq: Once | INTRAVENOUS | Status: AC
  Administered 2015-06-02: 1000 mL via INTRAVENOUS

## 2015-06-02 MED ORDER — METOCLOPRAMIDE HCL 5 MG/ML IJ SOLN
10.0000 mg | Freq: Once | INTRAMUSCULAR | Status: AC
  Administered 2015-06-02: 10 mg via INTRAVENOUS
  Filled 2015-06-02: qty 2

## 2015-06-02 MED ORDER — HYDROCHLOROTHIAZIDE 25 MG PO TABS: 25.0000 mg | ORAL_TABLET | Freq: Every day | ORAL | Status: AC

## 2015-06-02 MED ORDER — LOSARTAN POTASSIUM 50 MG PO TABS
50.0000 mg | ORAL_TABLET | Freq: Every day | ORAL | Status: DC
  Administered 2015-06-02: 50 mg via ORAL
  Filled 2015-06-02: qty 1

## 2015-06-02 NOTE — Discharge Instructions (Signed)
Please take blood pressure medications as prescribed as it will help with your headache.  Use resources below to find a primary care provider for further management of your health.     Emergency Department Resource Guide 1) Find a Doctor and Pay Out of Pocket Although you won't have to find out who is covered by your insurance plan, it is a good idea to ask around and get recommendations. You will then need to call the office and see if the doctor you have chosen will accept you as a new patient and what types of options they offer for patients who are self-pay. Some doctors offer discounts or will set up payment plans for their patients who do not have insurance, but you will need to ask so you aren't surprised when you get to your appointment.  2) Contact Your Local Health Department Not all health departments have doctors that can see patients for sick visits, but many do, so it is worth a call to see if yours does. If you don't know where your local health department is, you can check in your phone book. The CDC also has a tool to help you locate your state's health department, and many state websites also have listings of all of their local health departments.  3) Find a Walk-in Clinic If your illness is not likely to be very severe or complicated, you may want to try a walk in clinic. These are popping up all over the country in pharmacies, drugstores, and shopping centers. They're usually staffed by nurse practitioners or physician assistants that have been trained to treat common illnesses and complaints. They're usually fairly quick and inexpensive. However, if you have serious medical issues or chronic medical problems, these are probably not your best option.  No Primary Care Doctor: - Call Health Connect at  [phone] - they can help you locate a primary care doctor that  accepts your insurance, provides certain services, etc. - Physician Referral Service- [phone]  Chronic Pain  Problems: Organization         Address  Phone   Notes  [facility]  [phone] Patients need to be referred by their primary care doctor.   Medication Assistance: Organization         Address  Phone   Notes  [facility] Medication [facility] [address] [place] [address] [place], [place] [address] [phone] --Must be a resident of [facility] -- Must have NO insurance coverage whatsoever (no Medicaid/ Medicare, etc.) -- The pt. MUST have a primary care doctor that directs their care regularly and follows them in the community   MedAssist  [phone]   [employer]  [phone]    Agencies that provide inexpensive medical care: Organization         Address  Phone   Notes  [facility]  [phone]   [facility]    [phone]   [facility] [place] [address] [place], [place] [address] [phone]   [facility] [address] [place], [place] [phone]   Planned Parenthood    [phone]   [facility]    [phone]   [facility]  [address], [place] Phone:  [phone], Fax:  [phone] Hours of Operation:  9 am - 6 pm, M-F.  Also accepts Medicaid/Medicare and self-pay.  [facility]  [address], [place] Phone: [phone], Fax: [phone]. Hours of Operation:  8:30 am - 5:30 pm, M-F.  Also accepts Medicaid and self-pay.  °[facility] [place] [address], [place] Phone: [phone]   °[facility] [address], [place], [place] [phone], Ext. [phone] Mondays & Thursdays: 7-9 AM.  First 15 patients are seen on a first come, first serve basis. °  ° °Medicaid-accepting [place] Providers: ° °Organization         Address  Phone   Notes  °[facility] [address], [place] [phone] Also  accepts self-pay patients.  °[facility] [address], [place] ° [phone]   °[facility] [address], [place] [phone]   °[facility] [address] [place] [address], [place] [phone]   °Veita Bland [address], [place]  ° [phone] Only accepts [place] Access Medicaid patients after they have their name applied to their card.  ° °Self-Pay (no insurance) in [place]: ° °Organization         Address  Phone   Notes  °Sickle Cell Patients, [facility] [address], [place] [phone]   °[facility] [address], [place] [phone]   °[facility] [place] ° [address], [place] [phone]) [phone]   °[facility]/Dr. Osei-Bonsu ° [address] [place] [address], [place] or [address], [place] [phone] Phone number for both [place] and [place] locations is the same.  °[facility] [address], [place] [phone]) [phone]   °[facility] [address] [place] [address], [place] or [address] [phone] °([phone]) [phone]   °[facility] [address], [place] [phone], phone; [phone], fax Sees patients 1st and 3rd Saturday of every month.  Must not qualify for public or private insurance (i.e. Medicaid, Medicare, [place] Health Choice, Veterans' Benefits) • Household income should be no more than 200% of the poverty level •The clinic cannot treat you if you are pregnant or think you are pregnant • Sexually transmitted diseases are not treated at the clinic.  ° ° °Dental Care: °Organization         Address  Phone  Notes  °[place] [facility] [address], [place] [phone]) [phone] Accepts children up to age 21 who are enrolled in Medicaid or [place] Health Choice; pregnant  women with a Medicaid card; and children who have applied for Medicaid or [place] Health Choice, but were declined, whose parents can pay a reduced fee at time of service.  °[place] [facility] [place]  [address], [place] [phone]) [phone] Accepts children up to age 21 who are enrolled in Medicaid or [place] Health Choice; pregnant women with a Medicaid card; and children who have applied for Medicaid or  Health Choice, but were declined, whose parents can pay a reduced fee at time of service.  °[facility] ° [address],  [phone] Patients are seen by appointment only. Walk-ins are not accepted. [facility] will see patients 18 years of age and older. °Monday - Tuesday (8am-5pm) °Most Wednesdays (8:30-5pm) °$30 per visit, cash only  °[facility]  [address], [facility] [phone] Patients are seen by appointment only. Walk-ins are not accepted. [place] will see patients 60 years of age and older. One Wednesday Evening (Monthly: Volunteer Based).  $30 per visit, cash only  [place]  [phone] for adults; Children under age 4, call Graduate Pediatric Dentistry at [phone]. Children aged 49-14, please call [phone] to request a pediatric application.  Dental services are provided in all areas of dental care including fillings, crowns and bridges, complete and partial dentures, implants, gum treatment, root canals, and extractions. Preventive care is also provided. Treatment is provided to both adults and children. Patients are selected via a lottery and there is often a waiting list.   [facility] [address] [place]  [phone] [email or web address]   [facility] [address] [place], [place] [phone], Ext. [phone] Second and Fourth Thursday of each month, opens at 6:30 AM; Clinic ends at 9 AM.  Patients are  seen on a first-come first-served basis, and a limited number are seen during each clinic.   [facility]  [address] Hillard Danker [place], [place] [phone]   Eligibility Requirements You must have lived in [place], [place], or [place] counties for at least the last three months.   You cannot be eligible for state or federal sponsored [employer], including [employer], [place], or [employer].   You generally cannot be eligible for healthcare insurance through your employer.    How to apply: Eligibility screenings are held every Tuesday and Wednesday afternoon from 1:00 pm until 4:00 pm. You do not need an appointment for the interview!  [facility] [address] [place], [place]   [place] Department  [place]  [phone]    Behavioral Health Resources in the Community: Intensive Outpatient Programs Organization         Address  Phone  Notes  [facility] [place]. [address], [place], [place] [phone]   [facility] Outpatient [address], [place], [place]   ADS: [facility] [address] [address], [place], [place]   [place] [address],  [place], [place] or [phone]   Substance Abuse Resources Organization         Address  Phone  Notes  [facility] Drug Services  [phone]   [place]  [phone]   [facility] [place]   Chinita Pester  [phone]   Residential & Outpatient Substance Abuse Program  [phone]   Psychological Services Organization         Address  Phone  Notes  [facility] [place]  [place]  [phone]   [place] [address] [place] [phone] or [phone]    Mobile Crisis  Teams Organization         Address  Phone  Notes  [facility], Mobile Crisis Care Unit  [phone]   [facility]  [address] [place], [place]   [facility] [address] [place] [phone]    Self-Help/Support Groups Organization         Address  Phone             Notes  [facility]  [facility] [place] - variety of support groups  [place] Call for more information  Narcotics Anonymous (NA), [facility] [address], [employer] [place]  2 meetings at this location   Special educational needs teacher         Address  Phone  Notes  [facility] [place],    [place]  1-(717)[phone]   [facility]  [address], [place] [address], [place], [place]   [place], [place] [phone] Admissions: 8am-3pm M-F  Incentives Substance [place] [address],    [place], [place] [phone]   [facility] [address] [place], [place], [place]   [facility] [address]  [place], [place]   [facility] - Intensive Outpatient [address], [place], [place]   [facility]  [place], [place] [phone] or [phone]   [facility] ([facility]) [address] [place], [place] Accepts Medicaid  [facility] [place] [address],  [place] [phone] Substance Abuse/Addiction Treatment   [facility] Organization         Address  Phone  Notes  [facility]  [phone]   Domenic Schwab, PhD [address] Arlis Porta [place], [place]   [phone] or [phone]   [place], [place] [phone]   [facility] [address], [place], [place] [phone]  Insurance/Medicaid/sponsorship through [facility] and Families [address] [place] [address]                                    [place], [place] [phone] [place] [address], [place] [phone]    Dr. Adele Schilder  [phone]   [facility] Dept. 1) [address] [place] 2) [place] 3)  [place] [address], [place] [phone]  [phone]   [place] [phone] or [phone] (After Hours)

## 2015-06-02 NOTE — ED Notes (Addendum)
PA at bedside.

## 2015-06-02 NOTE — ED Provider Notes (Signed)
CSN: [id number]     Arrival date & time 06/02/15  1528 History   First MD Initiated Contact with Patient 06/02/15 1606     Chief Complaint  Patient presents with  . Hypertension     (Consider location/radiation/quality/duration/timing/severity/associated sxs/prior Treatment) HPI   39 year old male with history of migraine, hypertension who presents for evaluation of headache, and also requesting for medication refill. Patient mentioned that he normally developed headache if he runs out of his blood pressure medication. He has been without his blood pressure medication including hydrochlorothiazide, and losartan for the past week. For the past 2 days he developed gradual onset of left-sided headache which he described as a sharp and throbbing sensation starting from his neck radiates to the left side of head, persistent, worsening, 9/10 with mild nausea. 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 He denies having any fever, chills, diplopia, difficulty thinking, facial droop, focal numbness or weakness, neck stiffness, or rash. He reports intermittent midsternal chest discomfort associated with eating but none recently. He denies having any shortness of breath, productive cough, hemoptysis, abdominal pain, back pain. He is currently trying to establish a primary care provider in [place].   Past Medical History  Diagnosis Date  . Hypertension   . Migraine    History reviewed. No pertinent past surgical history. History reviewed. No pertinent family history. History  Substance Use Topics  . Smoking status: Former Smoker -- 0.50 packs/day    Types: Cigarettes    Quit date: 12/31/2011  . Smokeless tobacco: Not on file  . Alcohol Use: No    Review of Systems  All other systems reviewed and are negative.     Allergies  Amlodipine and Penicillins  Home Medications   Prior to Admission medications   Medication Sig Start Date  End Date Taking? Authorizing Provider  hydrochlorothiazide (HYDRODIURIL) 25 MG tablet Take 1 tablet (25 mg total) by mouth daily. 02/08/15   Gerhard Munch, MD  HYDROcodone-acetaminophen (NORCO/VICODIN) 5-325 MG per tablet Take 1 tablet by mouth every 4 (four) hours as needed for moderate pain or severe pain. Patient not taking: Reported on 02/08/2015 07/29/14   Harle Battiest, NP  ibuprofen (ADVIL,MOTRIN) 200 MG tablet Take 400 mg by mouth 2 (two) times daily as needed for moderate pain.     Historical Provider, MD  indomethacin (INDOCIN) 25 MG capsule Take 2 capsules (50 mg total) by mouth 3 (three) times daily as needed. Patient not taking: Reported on 10/30/2014 07/29/14   Harle Battiest, NP  losartan (COZAAR) 50 MG tablet Take 1 tablet (50 mg total) by mouth daily. 02/08/15   Gerhard Munch, MD  promethazine (PHENERGAN) 25 MG tablet Take 1 tablet (25 mg total) by mouth every 6 (six) hours as needed for nausea or vomiting. Patient not taking: Reported on 02/08/2015 10/30/14   Tilden Fossa, MD   BP 186/116 mmHg  Pulse 87  Temp(Src) 98.8 F (37.1 C) (Oral)  Resp 14  SpO2 97% Physical Exam  Constitutional: He is oriented to person, place, and time. He appears well-developed and well-nourished. No distress.  HENT:  Head: Atraumatic.  Right Ear: External ear normal.  Left Ear: External ear normal.  Nose: Nose normal.  Mouth/Throat: Oropharynx is clear and moist. No oropharyngeal exudate.  Eyes: Conjunctivae and EOM are normal. Pupils are equal, round, and reactive to light.  Neck: Normal range of motion. Neck supple. No JVD present.  No nuchal rigidity  Cardiovascular: Normal rate and regular rhythm.   Pulmonary/Chest: Effort normal and breath sounds normal.  Abdominal: Soft. There is no tenderness.  Musculoskeletal: He exhibits no edema.  Lymphadenopathy:    He has no cervical adenopathy.  Neurological: He is alert and oriented to person, place, and time.  Neurologic  exam:  Speech clear, pupils equal round reactive to light, extraocular movements intact  Normal peripheral visual fields Cranial nerves III through XII normal including no facial droop Follows commands, moves all extremities x4, normal strength to bilateral upper and lower extremities at all major muscle groups including grip Sensation normal to light touch  Coordination intact, no limb ataxia, finger-nose-finger normal Rapid alternating movements normal No pronator drift Gait normal   Skin: No rash noted.  Psychiatric: He has a normal mood and affect.  Nursing note and vitals reviewed.   ED Course  Procedures (including critical care time)  Patient here requesting for medication refill of his blood pressure medication. He also endorsed gradual onset of headache. He has no focal neuro deficit exam concerning for stroke. No fever or nuchal rigidity concerning for meningitis. No acute onset thunderclap headache concerning for subarachnoid hemorrhage. Patient is mentating appropriately. No active chest pain. Plan to provide patient with a migraine cocktail, blood pressure medication, and we'll continue to monitor.  6:43 PM After receiving Benadryl, Reglan, and IV fluid as a migraine cocktail, patient report mild improvement of his headache but still endorsed 6 out of 10 pain. Plan to provide patient with Fioricet for his headache. His blood pressure has improved with blood pressure medication. He has evidence of renal insufficiency, improved from baseline.  Care discussed with Dr. Clydene Pugh  Labs Review Labs Reviewed  BASIC METABOLIC PANEL - Abnormal; Notable for the following:    BUN 23 (*)    Creatinine, Ser 2.04 (*)    GFR calc non Af Amer 40 (*)    GFR calc Af Amer 46 (*)    All other components within normal limits  CBC - Abnormal; Notable for the following:    WBC 10.9 (*)    All other components within normal limits  I-STAT TROPOININ, ED    Imaging Review No results  found.   EKG Interpretation   Date/Time:  Monday June 02 2015 15:44:52 EDT Ventricular Rate:  87 PR Interval:  168 QRS Duration: 76 QT Interval:  328 QTC Calculation: 394 R Axis:   1 Text Interpretation:  Normal sinus rhythm Nonspecific T wave abnormality  Abnormal ECG since last tracing no significant change Confirmed by MILLER   MD, BRIAN ([id number]) on 06/02/2015 3:57:21 PM      MDM   Final diagnoses:  Essential hypertension  Bad headache    BP 160/113 mmHg  Pulse 80  Temp(Src) 98.8 F (37.1 C) (Oral)  Resp 21  SpO2 99%     Fayrene Helper, PA-C 06/02/15 1903  Lyndal Pulley, MD 06/03/15 0001

## 2015-06-02 NOTE — ED Notes (Signed)
Hes been having intermittent headaches and CP for past few days, he tried otc pain meds with no relief. He states his BP has been elevated but hes been unable to get a PCP appt for management of htn.

## 2016-08-22 ENCOUNTER — Ambulatory Visit (INDEPENDENT_AMBULATORY_CARE_PROVIDER_SITE_OTHER): Payer: BLUE CROSS/BLUE SHIELD

## 2016-08-22 ENCOUNTER — Ambulatory Visit (HOSPITAL_COMMUNITY)
Admission: EM | Admit: 2016-08-22 | Discharge: 2016-08-22 | Disposition: A | Discharge status: Home / Self Care | Payer: BLUE CROSS/BLUE SHIELD | Attending: Family Medicine | Admitting: Family Medicine

## 2016-08-22 ENCOUNTER — Encounter (HOSPITAL_COMMUNITY): Payer: Self-pay | Admitting: Emergency Medicine

## 2016-08-22 DIAGNOSIS — Z87891 Personal history of nicotine dependence: Secondary | ICD-10-CM | POA: Insufficient documentation

## 2016-08-22 DIAGNOSIS — M109 Gout, unspecified: Secondary | ICD-10-CM | POA: Diagnosis not present

## 2016-08-22 DIAGNOSIS — Z79899 Other long term (current) drug therapy: Secondary | ICD-10-CM | POA: Insufficient documentation

## 2016-08-22 DIAGNOSIS — M79672 Pain in left foot: Secondary | ICD-10-CM | POA: Diagnosis present

## 2016-08-22 HISTORY — DX: Gout, unspecified: M10.9

## 2016-08-22 LAB — POCT I-STAT, CHEM 8
BUN: 21 mg/dL — AB (ref 6–20)
CREATININE: 1.8 mg/dL — AB (ref 0.61–1.24)
Calcium, Ion: 1.19 mmol/L (ref 1.15–1.40)
Chloride: 102 mmol/L (ref 101–111)
GLUCOSE: 99 mg/dL (ref 65–99)
HCT: 46 % (ref 39.0–52.0)
HEMOGLOBIN: 15.6 g/dL (ref 13.0–17.0)
Potassium: 4.3 mmol/L (ref 3.5–5.1)
Sodium: 139 mmol/L (ref 135–145)
TCO2: 25 mmol/L (ref 0–100)

## 2016-08-22 LAB — URIC ACID: Uric Acid, Serum: 9.5 mg/dL — ABNORMAL HIGH (ref 4.4–7.6)

## 2016-08-22 MED ORDER — METHYLPREDNISOLONE ACETATE 80 MG/ML IJ SUSP
80.0000 mg | Freq: Once | INTRAMUSCULAR | Status: AC
  Administered 2016-08-22: 80 mg via INTRAMUSCULAR

## 2016-08-22 MED ORDER — METHYLPREDNISOLONE ACETATE 80 MG/ML IJ SUSP
INTRAMUSCULAR | Status: AC
  Filled 2016-08-22: qty 1

## 2016-08-22 NOTE — Discharge Instructions (Signed)
Elevate foot to help decrease swelling.

## 2016-08-22 NOTE — ED Triage Notes (Signed)
[facility] a [facility] of recurrent left foot pain x 1 month. The patient reported a hx of gout. The patient stated that his PCP prescribed prednisone at the beginning of the month secondary to a phone evaluation. The patient stated that it cleared up for `1 week and then returned. He reported that he was supposed to return to see his PCP for blood work but has not.

## 2016-08-22 NOTE — ED Provider Notes (Signed)
[facility]    CSN: [id number] Arrival date & time: 08/22/16  1212     History   Chief Complaint Chief Complaint  Patient presents with  . Foot Pain    HPI Hector Freeman is a 40 y.o. male.   HPI Patient presents today with one day hx of increased foot pain and swelling.  States that he has a hx of gout.  Has had about 4 attacks the last 6 months and this is more than he normally has.  A few weeks ago he was called in a script for prednisone taper by PCP Dr Sharalyn Ink for a gout attack and states that he did get better.  He did not keep appointment with Dr Providence Lanius as she recommended.  Pain with ambulation.  Denies injury.  No fever, chills.  Has a hx of abn renal function and this has been followed by Dr Providence Lanius. Currently taking HCTZ.  Past Medical History:  Diagnosis Date  . Gout   . Hypertension   . Migraine     There are no active problems to display for this patient.   History reviewed. No pertinent surgical history.     Home Medications    Prior to Admission medications   Medication Sig Start Date End Date Taking? Authorizing Provider  amLODipine (NORVASC) 10 MG tablet Take 10 mg by mouth daily.   Yes Historical Provider, MD  cloNIDine (CATAPRES) 0.1 MG tablet Take 0.1 mg by mouth 2 (two) times daily.   Yes Historical Provider, MD  losartan (COZAAR) 50 MG tablet Take 1 tablet (50 mg total) by mouth daily. 06/02/15  Yes Fayrene Helper, PA-C  metoprolol succinate (TOPROL-XL) 50 MG 24 hr tablet Take 50 mg by mouth daily. Take with or immediately following a meal.   Yes Historical Provider, MD  hydrochlorothiazide (HYDRODIURIL) 25 MG tablet Take 1 tablet (25 mg total) by mouth daily. 06/02/15   Fayrene Helper, PA-C  HYDROcodone-acetaminophen (NORCO/VICODIN) 5-325 MG per tablet Take 1 tablet by mouth every 4 (four) hours as needed for moderate pain or severe pain. Patient not taking: Reported on 02/08/2015 07/29/14   Harle Battiest, NP  ibuprofen (ADVIL,MOTRIN) 200  MG tablet Take 400 mg by mouth 2 (two) times daily as needed for moderate pain.     Historical Provider, MD  indomethacin (INDOCIN) 25 MG capsule Take 2 capsules (50 mg total) by mouth 3 (three) times daily as needed. Patient not taking: Reported on 10/30/2014 07/29/14   Harle Battiest, NP  promethazine (PHENERGAN) 25 MG tablet Take 1 tablet (25 mg total) by mouth every 6 (six) hours as needed for nausea or vomiting. Patient not taking: Reported on 02/08/2015 10/30/14   Tilden Fossa, MD    Family History History reviewed. No pertinent family history.  Social History Social History  Substance Use Topics  . Smoking status: Former Smoker    Packs/day: 0.50    Types: Cigarettes    Quit date: 12/31/2011  . Smokeless tobacco: Never Used  . Alcohol use No     Allergies   Amlodipine and Penicillins   Review of Systems Review of Systems  Constitutional: Positive for activity change. Negative for chills and fever.  HENT: Negative.   Respiratory: Negative.   Cardiovascular: Negative.   Gastrointestinal: Negative.   Musculoskeletal: Positive for joint swelling.  Neurological: Negative.   Psychiatric/Behavioral: Negative.      Physical Exam Triage Vital Signs ED Triage Vitals  Enc Vitals Group     BP 08/22/16  1304 (!) 178/123     Pulse Rate 08/22/16 1304 116     Resp 08/22/16 1304 18     Temp 08/22/16 1304 98.5 F (36.9 C)     Temp Source 08/22/16 1304 Oral     SpO2 08/22/16 1304 96 %     Weight --      Height --      Head Circumference --      Peak Flow --      Pain Score 08/22/16 1312 10     Pain Loc --      Pain Edu? --      Excl. in GC? --    No data found.   Updated Vital Signs BP (!) 178/123 (BP Location: Right Arm) Comment: notified brent wooters  Pulse 116   Temp 98.5 F (36.9 C) (Oral)   Resp 18   SpO2 96%   Visual Acuity Right Eye Distance:   Left Eye Distance:   Bilateral Distance:    Right Eye Near:   Left Eye Near:    Bilateral Near:      Physical Exam  Constitutional: He is oriented to person, place, and time. No distress.  HENT:  Head: Normocephalic and atraumatic.  Eyes: EOM are normal. Pupils are equal, round, and reactive to light.  Neck: Normal range of motion.  Pulmonary/Chest: Effort normal.  Musculoskeletal:  Moderate swelling left foot and ankle.  Ankle he is tender across the joint line.  Some limitation in ROM.  Moderate-marked TTP across the midfoot.  1st MTP nontender.  NVI.  No signs of infection.   Neurological: He is alert and oriented to person, place, and time.  Skin: Skin is warm.  Psychiatric: He has a normal mood and affect.     UC Treatments / Results  Labs (all labs ordered are listed, but only abnormal results are displayed) Labs Reviewed  POCT I-STAT, CHEM 8 - Abnormal; Notable for the following:       Result Value   BUN 21 (*)    Creatinine, Ser 1.80 (*)    All other components within normal limits  URIC ACID    EKG  EKG Interpretation None       Radiology Dg Foot Complete Left  Result Date: 08/22/2016 CLINICAL DATA:  Left foot pain for several years, initial encounter EXAM: LEFT FOOT - COMPLETE 3+ VIEW COMPARISON:  07/29/2014 FINDINGS: There is no evidence of fracture or dislocation. There is no evidence of arthropathy or other focal bone abnormality. Soft tissues are unremarkable. IMPRESSION: No acute abnormality noted. Electronically Signed   By: Alcide Clever M.D.   On: 08/22/2016 15:09    Procedures Procedures (including critical care time)  Medications Ordered in UC Medications  methylPREDNISolone acetate (DEPO-MEDROL) injection 80 mg (80 mg Intramuscular Given 08/22/16 1538)     Initial Impression / Assessment and Plan / UC Course  I have reviewed the triage vital signs and the nursing notes.  Pertinent labs & imaging results that were available during my care of the patient were reviewed by me and considered in my medical decision making (see chart for  details).  Clinical Course      Final Clinical Impressions(s) / UC Diagnoses   Final diagnoses:  Acute gout of left foot, unspecified cause    New Prescriptions New Prescriptions   No medications on file  to try to give patient some relief we did give him a IM Depo 80mg  injection today.  Just had prednisone taper  a few weeks ago and didn't want to give another today.  Unable to use NSAIDs due to abn renal function.  Patient using HCTZ which could predispose him to recurrent gout attacks.  He will discuss this further with PCP and needs to call her office tomorrow to schedule appointment.  Gave patient a postop shoe for comfort.  Note for out of work x 2 days. All questions answered.    Naida SleightJames M Keziah Avis, PA-C 08/22/16 1544

## 2016-09-27 IMAGING — CT CT HEAD W/O CM
1 series · 16 of 30 positions shown, 20 images · non-contrast
Comparison: Prior study from 08/06/2012

CLINICAL DATA: Headache and nausea.  History of migraines.

EXAM:
CT HEAD WITHOUT CONTRAST
TECHNIQUE: Contiguous axial images were obtained from the base of the skull
through the vertex without intravenous contrast.

[Series 2: head 5.0 h30s · axial · 0.42mm/px · z∈[-218,-73]mm · 16 of 33 slices shown, 20 images]
[im 2/33  brain]
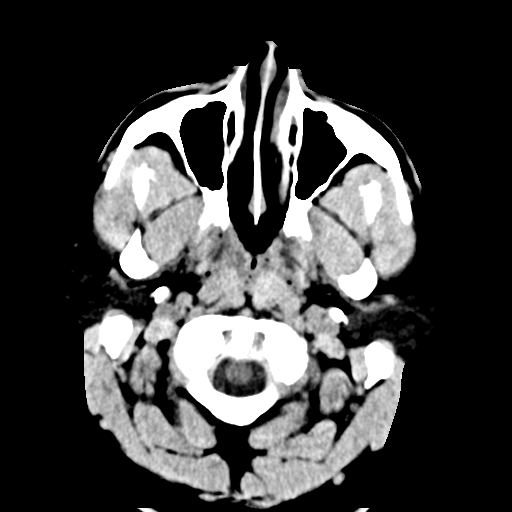
[im 2/33  bone]
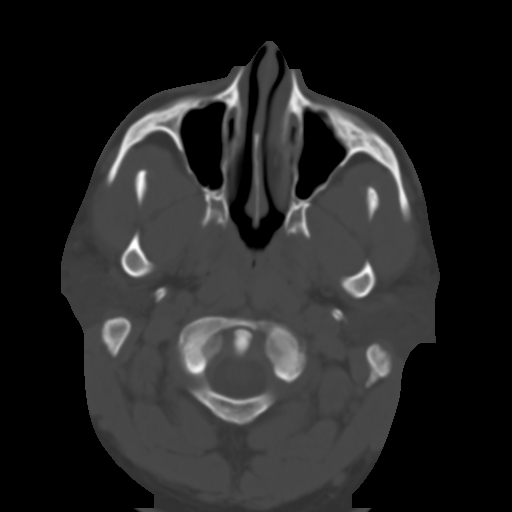
[im 4/33  brain]
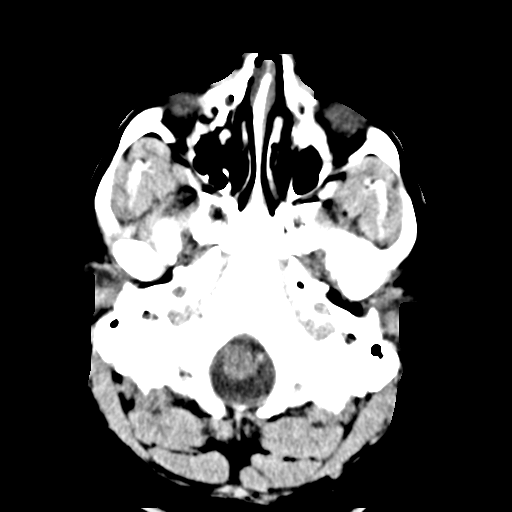
[im 6/33  brain]
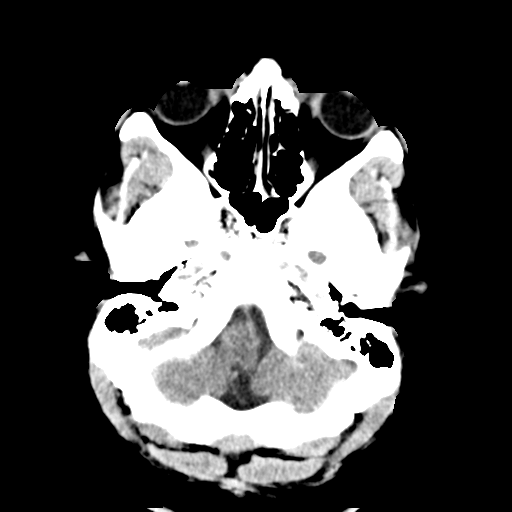
[im 8/33  brain]
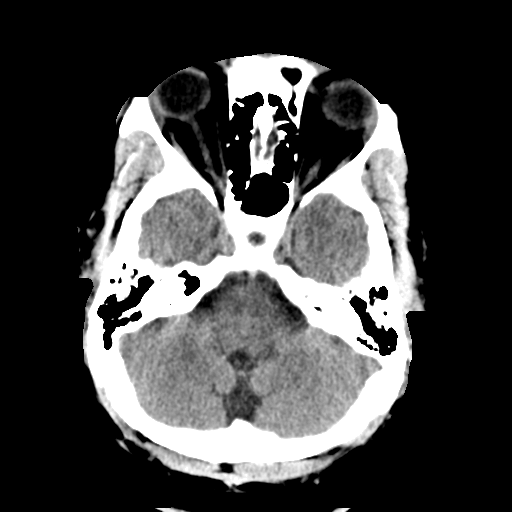
[im 9/33  brain]
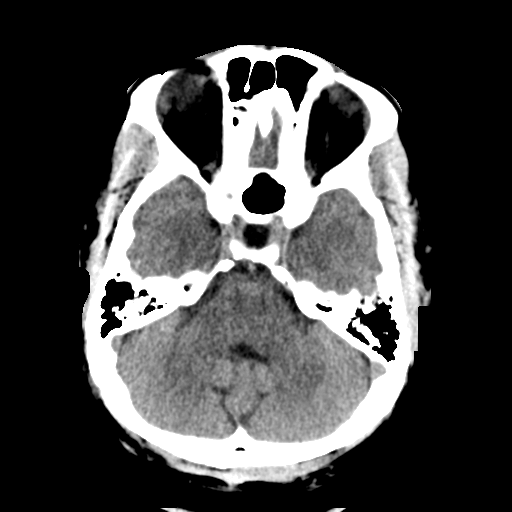
[im 9/33  bone]
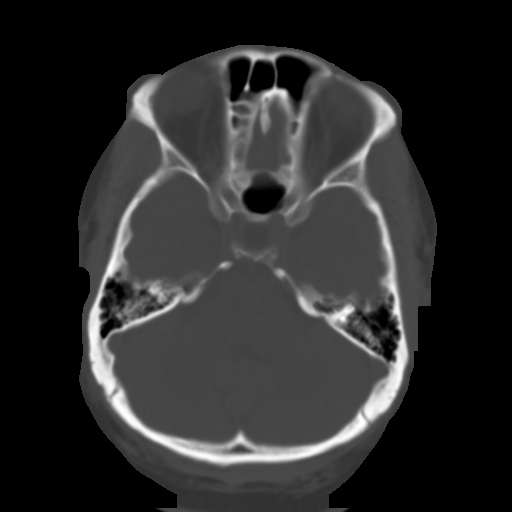
[im 12/33  brain]
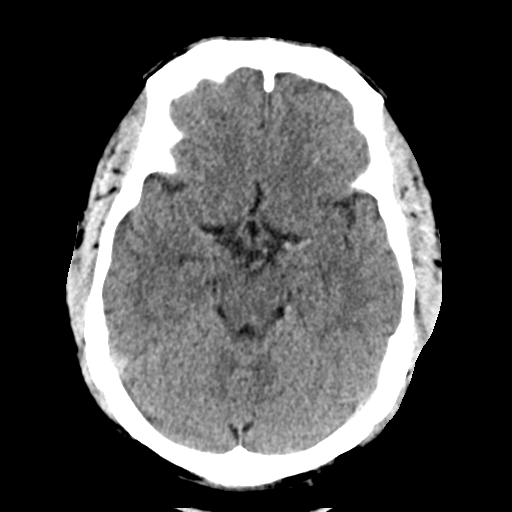
[im 14/33  brain]
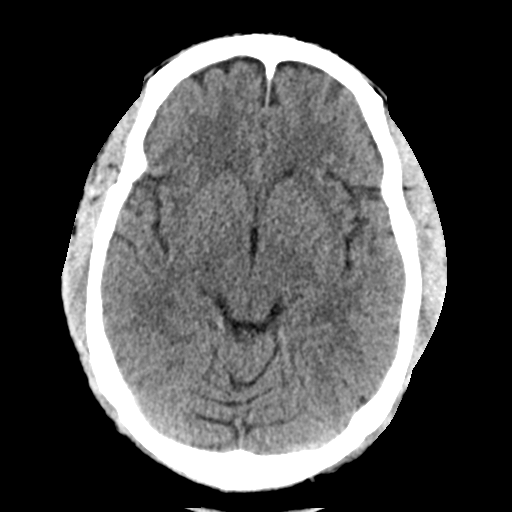
[im 16/33  brain]
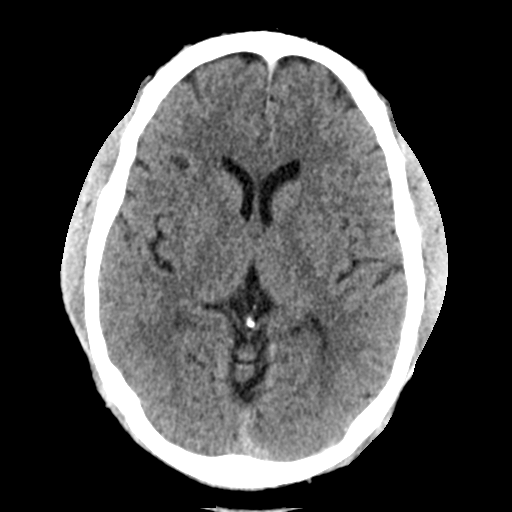
[im 17/33  brain]
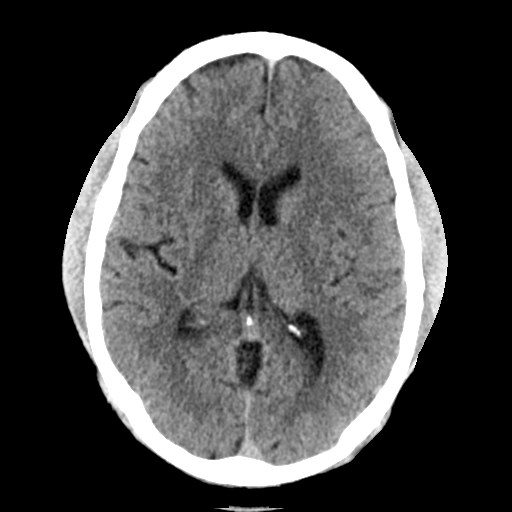
[im 17/33  bone]
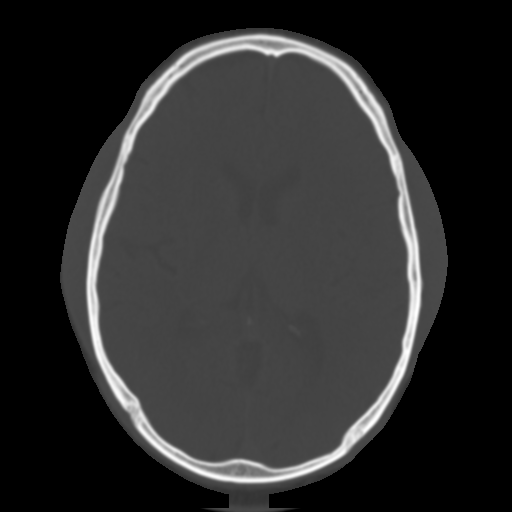
[im 19/33  brain]
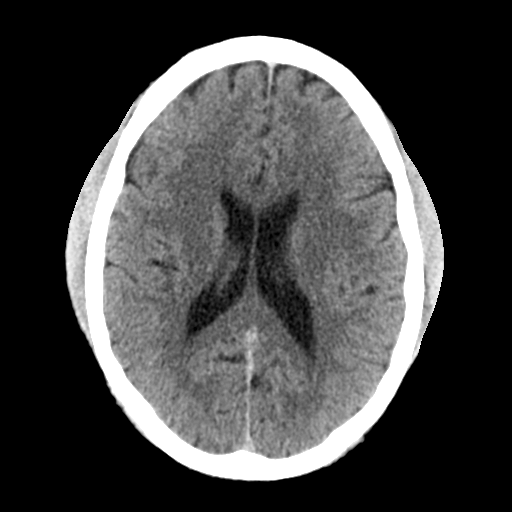
[im 21/33  brain]
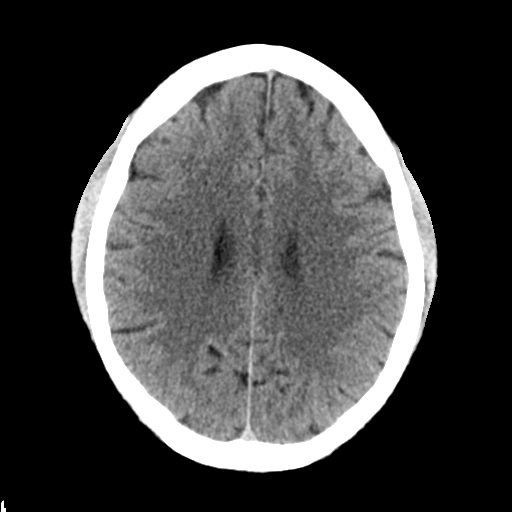
[im 24/33  brain]
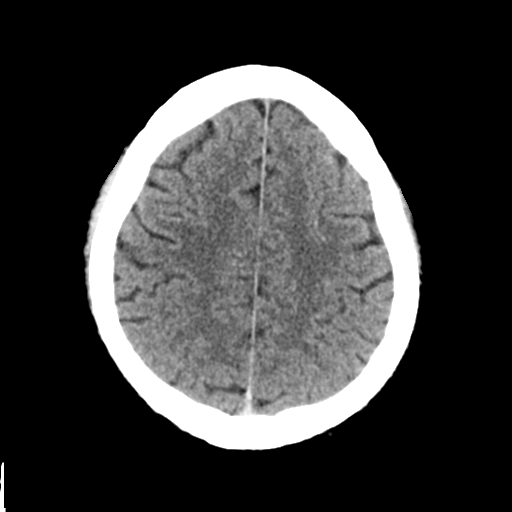
[im 25/33  brain]
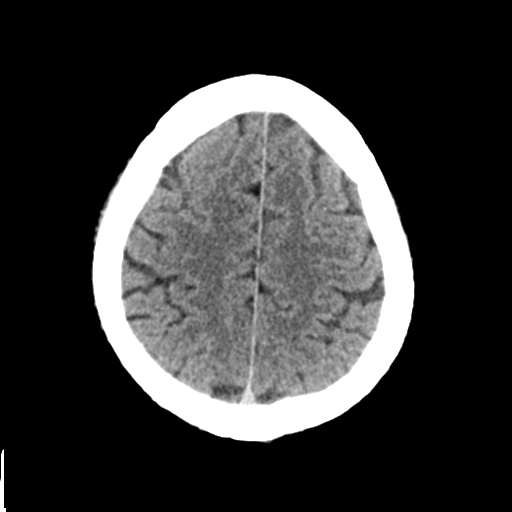
[im 25/33  bone]
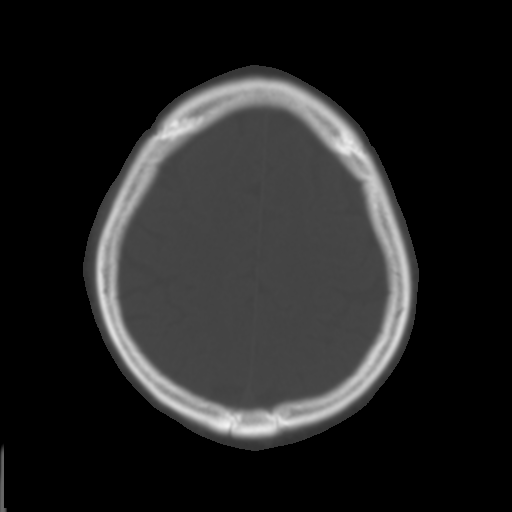
[im 27/33  brain]
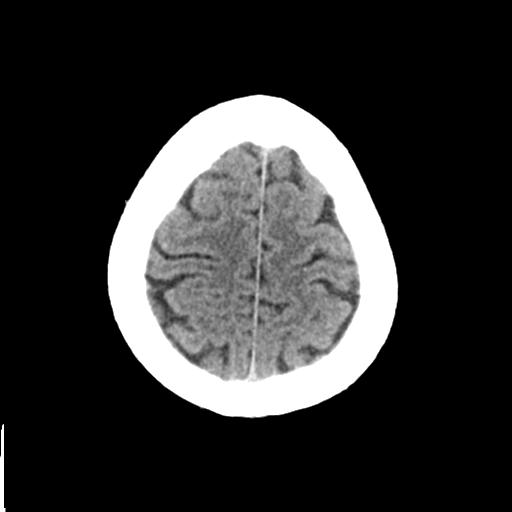
[im 29/33  brain]
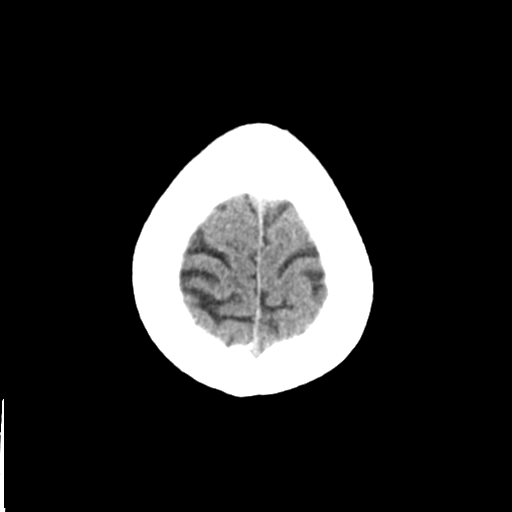
[im 31/33  brain]
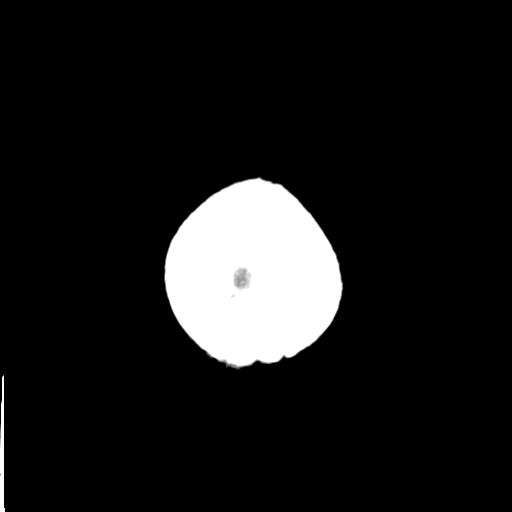

[16 of 30 positions shown; findings below may reference images not displayed]

FINDINGS: There is no acute intracranial hemorrhage or infarct. No mass lesion
or midline shift. Gray-white matter differentiation is well
maintained. Ventricles are normal in size without evidence of
hydrocephalus. CSF containing spaces are within normal limits. No
extra-axial fluid collection.

The calvarium is intact.

Orbital soft tissues are within normal limits.

Mild mucosal thickening present within the maxillary sinuses
bilaterally as well as the ethmoidal air cells. No air-fluid levels
within the paranasal sinuses. Mastoid air cells are clear.

Scalp soft tissues are unremarkable.
IMPRESSION: Normal head CT with no acute intracranial abnormality identified.

## 2021-02-23 ENCOUNTER — Encounter (HOSPITAL_COMMUNITY): Payer: Self-pay | Admitting: Emergency Medicine

## 2021-02-23 ENCOUNTER — Emergency Department (HOSPITAL_COMMUNITY)
Admission: EM | Admit: 2021-02-23 | Discharge: 2021-02-24 | Disposition: A | Discharge status: Home / Self Care | Payer: BLUE CROSS/BLUE SHIELD | Attending: Emergency Medicine | Admitting: Emergency Medicine

## 2021-02-23 DIAGNOSIS — Z79899 Other long term (current) drug therapy: Secondary | ICD-10-CM | POA: Insufficient documentation

## 2021-02-23 DIAGNOSIS — R7989 Other specified abnormal findings of blood chemistry: Secondary | ICD-10-CM | POA: Insufficient documentation

## 2021-02-23 DIAGNOSIS — I10 Essential (primary) hypertension: Secondary | ICD-10-CM | POA: Insufficient documentation

## 2021-02-23 DIAGNOSIS — L0231 Cutaneous abscess of buttock: Secondary | ICD-10-CM | POA: Insufficient documentation

## 2021-02-23 DIAGNOSIS — I998 Other disorder of circulatory system: Secondary | ICD-10-CM

## 2021-02-23 DIAGNOSIS — Z87891 Personal history of nicotine dependence: Secondary | ICD-10-CM | POA: Insufficient documentation

## 2021-02-23 LAB — BASIC METABOLIC PANEL
Anion gap: 9 (ref 5–15)
BUN: 17 mg/dL (ref 6–20)
CO2: 27 mmol/L (ref 22–32)
Calcium: 8.8 mg/dL — ABNORMAL LOW (ref 8.9–10.3)
Chloride: 100 mmol/L (ref 98–111)
Creatinine, Ser: 1.94 mg/dL — ABNORMAL HIGH (ref 0.61–1.24)
GFR, Estimated: 43 mL/min — ABNORMAL LOW (ref >60)
Glucose, Bld: 92 mg/dL (ref 70–99)
Potassium: 3.5 mmol/L (ref 3.5–5.1)
Sodium: 136 mmol/L (ref 135–145)

## 2021-02-23 LAB — CBC
HCT: 43.4 % (ref 39.0–52.0)
Hemoglobin: 14.1 g/dL (ref 13.0–17.0)
MCH: 27.9 pg (ref 26.0–34.0)
MCHC: 32.5 g/dL (ref 30.0–36.0)
MCV: 85.9 fL (ref 80.0–100.0)
Platelets: 337 10*3/uL (ref 150–400)
RBC: 5.05 MIL/uL (ref 4.22–5.81)
RDW: 13.6 % (ref 11.5–15.5)
WBC: 14.6 10*3/uL — ABNORMAL HIGH (ref 4.0–10.5)
nRBC: 0 % (ref 0.0–0.2)

## 2021-02-23 NOTE — ED Triage Notes (Signed)
Emergency Medicine Provider Triage Evaluation Note  Hector Freeman , a 45 y.o. male  was evaluated in triage.  Pt complains of abscess on buttocks for the past week. He notes it began to drain yesterday. He admits to a subjective fever. Patient found to be hypertensive at triage. Patient denies headaches, visual changes, dizziness, and chest pain. He notes he has not been taking his medications due to lack of insurance.   Review of Systems  Positive: abscess Negative: headache  Physical Exam  BP (!) 224/146 (BP Location: Right Arm)   Pulse 97   Temp 98.8 F (37.1 C)   Resp 18   SpO2 99%  Gen:   Awake, no distress   HEENT:  Atraumatic  Resp:  Normal effort  Cardiac:  Normal rate  Abd:   Nondistended, nontender  MSK:   Moves extremities without difficulty  Neuro:  Speech clear   Medical Decision Making  Medically screening exam initiated at 8:14 PM.  Appropriate orders placed.  Adriana Mccallum was informed that the remainder of the evaluation will be completed by another provider, this initial triage assessment does not replace that evaluation, and the importance of remaining in the ED until their evaluation is complete.  Clinical Impression  Buttocks abscess. Unable to inspect in triage. Hypertensive. Asymptomatic. Labs to rule out infectious etiology and end organ damage.    Mannie Stabile, [place] 02/23/21 2016

## 2021-02-23 NOTE — ED Triage Notes (Signed)
Pt reports abscess to buttocks that burst, after being there for about a week. Pt reports subjective fever on Saturday. Pt with hx of HTN but hasn't taken meds due to changing jobs and lack of insurance.

## 2021-02-24 MED ORDER — LIDOCAINE-EPINEPHRINE (PF) 2 %-1:200000 IJ SOLN: 10.0000 mL | Freq: Once | INTRAMUSCULAR | Status: DC

## 2021-02-24 MED ORDER — CLONIDINE HCL 0.3 MG PO TABS: 0.3000 mg | ORAL_TABLET | Freq: Two times a day (BID) | ORAL | 0 refills | Status: AC

## 2021-02-24 MED ORDER — HYDROCHLOROTHIAZIDE 25 MG PO TABS
25.0000 mg | ORAL_TABLET | Freq: Once | ORAL | Status: AC
  Administered 2021-02-24: 25 mg via ORAL
  Filled 2021-02-24: qty 1

## 2021-02-24 MED ORDER — CARVEDILOL 25 MG PO TABS: 25.0000 mg | ORAL_TABLET | Freq: Two times a day (BID) | ORAL | 0 refills | Status: AC

## 2021-02-24 MED ORDER — AMLODIPINE BESYLATE 10 MG PO TABS: 10.0000 mg | ORAL_TABLET | Freq: Every day | ORAL | 0 refills | Status: AC

## 2021-02-24 MED ORDER — AMLODIPINE BESYLATE 5 MG PO TABS
10.0000 mg | ORAL_TABLET | Freq: Once | ORAL | Status: AC
  Administered 2021-02-24: 10 mg via ORAL
  Filled 2021-02-24: qty 2

## 2021-02-24 MED ORDER — DOXYCYCLINE HYCLATE 100 MG PO CAPS: 100.0000 mg | ORAL_CAPSULE | Freq: Two times a day (BID) | ORAL | 0 refills | Status: AC

## 2021-02-24 MED ORDER — LOSARTAN POTASSIUM 50 MG PO TABS
100.0000 mg | ORAL_TABLET | Freq: Once | ORAL | Status: AC
  Administered 2021-02-24: 100 mg via ORAL
  Filled 2021-02-24: qty 2

## 2021-02-24 MED ORDER — LIDOCAINE HCL (PF) 1 % IJ SOLN
5.0000 mL | Freq: Once | INTRAMUSCULAR | Status: AC
  Administered 2021-02-24: 5 mL
  Filled 2021-02-24: qty 5

## 2021-02-24 MED ORDER — CLONIDINE HCL 0.2 MG PO TABS
0.3000 mg | ORAL_TABLET | Freq: Once | ORAL | Status: AC
  Administered 2021-02-24: 0.3 mg via ORAL
  Filled 2021-02-24: qty 1

## 2021-02-24 MED ORDER — DOXYCYCLINE HYCLATE 100 MG PO TABS
100.0000 mg | ORAL_TABLET | Freq: Once | ORAL | Status: AC
  Administered 2021-02-24: 100 mg via ORAL
  Filled 2021-02-24: qty 1

## 2021-02-24 MED ORDER — HYDROCHLOROTHIAZIDE 25 MG PO TABS
25.0000 mg | ORAL_TABLET | Freq: Every day | ORAL | Status: DC
  Filled 2021-02-24 (×2): qty 1

## 2021-02-24 MED ORDER — CARVEDILOL 12.5 MG PO TABS
25.0000 mg | ORAL_TABLET | Freq: Once | ORAL | Status: AC
  Administered 2021-02-24: 25 mg via ORAL

## 2021-02-24 MED ORDER — CARVEDILOL 12.5 MG PO TABS
25.0000 mg | ORAL_TABLET | Freq: Two times a day (BID) | ORAL | Status: DC
  Filled 2021-02-24: qty 8

## 2021-02-24 MED ORDER — LOSARTAN POTASSIUM-HCTZ 100-25 MG PO TABS: 1.0000 | ORAL_TABLET | Freq: Every day | ORAL | 0 refills | Status: AC

## 2021-02-24 NOTE — Discharge Instructions (Signed)
You were seen in the emergency department for an abscess  We drained this today and put packing.  Keep packing in place for the next 48 hours.  Remove the packing and start doing firm, warm massage and compresses to help drain remaining pus and blood.  You may take 650 to 1000 mg of acetaminophen every 6 hours for pain.  Do not take ibuprofen which can hurt your kidneys.  Your blood pressure was very elevated today.  It is very important that you start taking your blood pressure medicines and follow-up with your primary care doctor for better blood pressure control.  Return to ER for chest pain, shortness of breath, leg swelling, severe headache, stroke symptoms.  Your kidney function is elevated, this is probably from long standing poorly controlled blood pressure. Discuss this with your primary care doctor.

## 2021-02-24 NOTE — ED Provider Notes (Addendum)
[facility] EMERGENCY DEPARTMENT Provider Note   CSN: [id number] Arrival date & time: 02/23/21  1958     History Chief Complaint  Patient presents with  . Abscess    Hector Freeman is a 45 y.o. male with history of hypertension presents to the ED for evaluation of abscess on his right buttock onset 1 week ago.  A few days ago it spontaneously started to drain.  Has been putting peroxide to clean it.  His mother was cleaning it earlier today and removed what looked like it worm.  He shows me a picture of it.  He had a fever of 101.3 on Saturday but it broke without any medicines.  Has not had fever since.  Denies any other infectious symptoms like cough, vomiting, diarrhea, dysuria.  Of note, patient's blood pressure is elevated.  He ran out of his blood pressure medicines in February because he did not have insurance.  He used to be on losartan/HCTZ, amlodipine, carvedilol and clonidine.  He is waiting for his insurance to kick in again to pick up these medicines.  He denies any severe headache, chest pain, shortness of breath, leg swelling.  HPI     Past Medical History:  Diagnosis Date  . Gout   . Hypertension   . Migraine     There are no problems to display for this patient.   History reviewed. No pertinent surgical history.     History reviewed. No pertinent family history.  Social History   Tobacco Use  . Smoking status: Former Smoker    Packs/day: 0.50    Types: Cigarettes    Quit date: 12/31/2011    Years since quitting: 9.1  . Smokeless tobacco: Never Used  Vaping Use  . Vaping Use: Former  Substance Use Topics  . Alcohol use: No  . Drug use: No    Home Medications Prior to Admission medications   Medication Sig Start Date End Date Taking? Authorizing Provider  amLODipine (NORVASC) 10 MG tablet Take 1 tablet (10 mg total) by mouth daily. 02/24/21 03/26/21 Yes Liberty Handy, PA-C  carvedilol (COREG) 25 MG tablet Take 1 tablet (25 mg  total) by mouth 2 (two) times daily with a meal. 02/24/21  Yes Liberty Handy, PA-C  cloNIDine (CATAPRES) 0.3 MG tablet Take 1 tablet (0.3 mg total) by mouth 2 (two) times daily. 02/24/21  Yes Liberty Handy, PA-C  doxycycline (VIBRAMYCIN) 100 MG capsule Take 1 capsule (100 mg total) by mouth 2 (two) times daily. 02/24/21  Yes Liberty Handy, PA-C  losartan-hydrochlorothiazide (HYZAAR) 100-25 MG tablet Take 1 tablet by mouth daily. 02/24/21  Yes Liberty Handy, PA-C  hydrochlorothiazide (HYDRODIURIL) 25 MG tablet Take 1 tablet (25 mg total) by mouth daily. 06/02/15   Fayrene Helper, PA-C  HYDROcodone-acetaminophen (NORCO/VICODIN) 5-325 MG per tablet Take 1 tablet by mouth every 4 (four) hours as needed for moderate pain or severe pain. Patient not taking: Reported on 02/08/2015 07/29/14   Harle Battiest, NP  ibuprofen (ADVIL,MOTRIN) 200 MG tablet Take 400 mg by mouth 2 (two) times daily as needed for moderate pain.     [provider]  indomethacin (INDOCIN) 25 MG capsule Take 2 capsules (50 mg total) by mouth 3 (three) times daily as needed. Patient not taking: Reported on 10/30/2014 07/29/14   Harle Battiest, NP  losartan (COZAAR) 50 MG tablet Take 1 tablet (50 mg total) by mouth daily. 06/02/15   Fayrene Helper, PA-C  metoprolol succinate (  TOPROL-XL) 50 MG 24 hr tablet Take 50 mg by mouth daily. Take with or immediately following a meal.    [provider]  promethazine (PHENERGAN) 25 MG tablet Take 1 tablet (25 mg total) by mouth every 6 (six) hours as needed for nausea or vomiting. Patient not taking: Reported on 02/08/2015 10/30/14   Tilden Fossa, MD  Olmesartan-Amlodipine-HCTZ [facility]) 40-5-12.5 MG TABS Take 1 tablet by mouth daily.  02/08/15  [provider]    Allergies    Amlodipine and Penicillins  Review of Systems   Review of Systems  Skin:       abscess  All other systems reviewed and are negative.   Physical Exam Updated Vital Signs BP  (!) 213/137   Pulse 88   Temp 98.9 F (37.2 C) (Oral)   Resp 18   SpO2 100%   Physical Exam Constitutional:      Appearance: He is well-developed.  HENT:     Head: Normocephalic.     Nose: Nose normal.  Eyes:     General: Lids are normal.  Cardiovascular:     Rate and Rhythm: Normal rate and regular rhythm.     Comments: No leg swelling  Pulmonary:     Effort: Pulmonary effort is normal. No respiratory distress.  Musculoskeletal:        General: Normal range of motion.     Cervical back: Normal range of motion.  Skin:    Comments: Mostly firm mildly tender abscess right buttock approx 3 x 5 cm with central fluctuance with purulent drainage. Minimal local erythema. No involvement of perianal skin.   Neurological:     Mental Status: He is alert.  Psychiatric:        Behavior: Behavior normal.     ED Results / Procedures / Treatments   Labs (all labs ordered are listed, but only abnormal results are displayed) Labs Reviewed  CBC - Abnormal; Notable for the following components:      Result Value   WBC 14.6 (*)    All other components within normal limits  BASIC METABOLIC PANEL - Abnormal; Notable for the following components:   Creatinine, Ser 1.94 (*)    Calcium 8.8 (*)    GFR, Estimated 43 (*)    All other components within normal limits    EKG None  Radiology No results found.  Procedures .Marland KitchenIncision and Drainage  Date/Time: 02/24/2021 5:01 AM Performed by: Liberty Handy, PA-C Authorized by: Liberty Handy, PA-C   Consent:    Consent obtained:  Verbal   Consent given by:  Patient   Risks discussed:  Bleeding, incomplete drainage, pain and damage to other organs   Alternatives discussed:  No treatment Universal protocol:    Procedure explained and questions answered to patient or proxy's satisfaction: yes     Relevant documents present and verified: yes     Required blood products, implants, devices, and special equipment available: yes      Site/side marked: yes     Immediately prior to procedure, a time out was called: yes     Patient identity confirmed:  Verbally with patient Location:    Type:  Abscess   Size:  3 x 5 Pre-procedure details:    Procedure prep: alcohol swab. Anesthesia:    Anesthesia method:  Local infiltration   Local anesthetic:  Lidocaine 1% w/o epi Procedure type:    Complexity:  Simple Procedure details:    Incision types:  Single straight  Incision depth:  Subcutaneous   Wound management:  Probed and deloculated and irrigated with saline   Drainage:  Purulent   Drainage amount:  Moderate   Packing materials:  1/4 in gauze Post-procedure details:    Procedure completion:  Tolerated well, no immediate complications     Medications Ordered in ED Medications  losartan (COZAAR) tablet 100 mg (has no administration in time range)  hydrochlorothiazide (HYDRODIURIL) tablet 25 mg (has no administration in time range)  carvedilol (COREG) tablet 25 mg (has no administration in time range)  amLODipine (NORVASC) tablet 10 mg (10 mg Oral Given 02/24/21 0449)  cloNIDine (CATAPRES) tablet 0.3 mg (0.3 mg Oral Given 02/24/21 0448)  doxycycline (VIBRA-TABS) tablet 100 mg (100 mg Oral Given 02/24/21 0449)  lidocaine (PF) (XYLOCAINE) 1 % injection 5 mL (5 mLs Infiltration Given 02/24/21 0450)    ED Course  I have reviewed the triage vital signs and the nursing notes.  Pertinent labs & imaging results that were available during my care of the patient were reviewed by me and considered in my medical decision making (see chart for details).  Clinical Course as of 02/24/21 0516  Tue Feb 24, 2021  0259 WBC(!): 14.6 [CG]  0259 BP(!): 211/134 [CG]  0259 Creatinine(!): 1.94 [CG]    Clinical Course User Index [CG] Jerrell MylarGibbons, Maeven Mcdougall J, PA-C   MDM Rules/Calculators/A&P                          45 yo M here with right buttock abscess which has been spontaneously draining for the last 1 week. Fever 3 days ago but  resolved. No chills.  This was incised and drained without immediately complications.  There was significant improvement in swelling and pain.  Packing placed. Labs ordered in triage as above. Mild leukocytosis 14.6.  Will discharge with antibiotics, packing which he will remove in 48 hours and start warm compresses, doxycycline, high dose NSAIDs, warm massage. Return precautions given.   Of note, patient noted to be very hypertensive in ER. He is completely asymptomatic and denies symptoms to suggest hypertensive emergency.  This is in setting of known hypertension and non compliance with losartan/hctz, amlodipine, coreg and clonidine since February.  Creatinine at his baseline, slightly elevated. No findings to suggest hypertensive emergency. Will give PO anti-hypertensives and discharge with refills.  Stressed importance of med compliance and PCP follow up.  He understands symptoms that would warrant return to ER including severe sudden headache, stroke symptoms, CP, SOB, edema.  He verbalized understanding.  Final Clinical Impression(s) / ED Diagnoses Final diagnoses:  Abscess of buttock, right  Poorly controlled blood pressure  Elevated serum creatinine    Rx / DC Orders ED Discharge Orders         Ordered    doxycycline (VIBRAMYCIN) 100 MG capsule  2 times daily        02/24/21 0511    amLODipine (NORVASC) 10 MG tablet  Daily        02/24/21 0511    losartan-hydrochlorothiazide (HYZAAR) 100-25 MG tablet  Daily        02/24/21 0511    cloNIDine (CATAPRES) 0.3 MG tablet  2 times daily        02/24/21 0511    carvedilol (COREG) 25 MG tablet  2 times daily with meals        02/24/21 0511           Liberty HandyGibbons, Azarius Lambson J, PA-C 02/24/21 [phone]  Zadie Rhine, MD 02/24/21 0516    Liberty Handy, PA-C 02/24/21 [id number]    Zadie Rhine, MD 02/24/21 [phone]

## 2023-09-21 ENCOUNTER — Ambulatory Visit: Payer: Self-pay | Attending: Cardiology | Admitting: Cardiology

## 2023-09-21 NOTE — Progress Notes (Deleted)
Clinical Summary Mr. Bentivegna is a 47 y.o.male  Former patient of Dr Boneta Lucks at [facility], last seen 06/2019  1.Resistant HTN -     2.OSA   3.CKD? - baseline Cr 2.3-2.4 - followed by neprhology - from notes renal disease thought mostly secondary to HTN  Prior [facility] records Echocardiogram 12/2018. Well preserved LV systolic function, LV EF 50-55%. Normal wall motion and wall thickness. No valvular heart disease.   Jan 2024 TC 189 TG 143 HDL 35 LDL 128    Past Medical History:  Diagnosis Date   Gout    Hypertension    Migraine      Allergies  Allergen Reactions   Amlodipine Other (See Comments)    "I feel like I'm dying"   Penicillins Other (See Comments)    Childhood allergy, unk rxn     Current Outpatient Medications  Medication Sig Dispense Refill   amLODipine (NORVASC) 10 MG tablet Take 1 tablet (10 mg total) by mouth daily. 30 tablet 0   carvedilol (COREG) 25 MG tablet Take 1 tablet (25 mg total) by mouth 2 (two) times daily with a meal. 30 tablet 0   cloNIDine (CATAPRES) 0.3 MG tablet Take 1 tablet (0.3 mg total) by mouth 2 (two) times daily. 30 tablet 0   doxycycline (VIBRAMYCIN) 100 MG capsule Take 1 capsule (100 mg total) by mouth 2 (two) times daily. 20 capsule 0   hydrochlorothiazide (HYDRODIURIL) 25 MG tablet Take 1 tablet (25 mg total) by mouth daily. 30 tablet 0   HYDROcodone-acetaminophen (NORCO/VICODIN) 5-325 MG per tablet Take 1 tablet by mouth every 4 (four) hours as needed for moderate pain or severe pain. (Patient not taking: Reported on 02/08/2015) 6 tablet 0   ibuprofen (ADVIL,MOTRIN) 200 MG tablet Take 400 mg by mouth 2 (two) times daily as needed for moderate pain.      indomethacin (INDOCIN) 25 MG capsule Take 2 capsules (50 mg total) by mouth 3 (three) times daily as needed. (Patient not taking: Reported on 10/30/2014) 30 capsule 0   losartan (COZAAR) 50 MG tablet Take 1 tablet (50 mg total) by mouth daily. 30 tablet 0    losartan-hydrochlorothiazide (HYZAAR) 100-25 MG tablet Take 1 tablet by mouth daily. 30 tablet 0   metoprolol succinate (TOPROL-XL) 50 MG 24 hr tablet Take 50 mg by mouth daily. Take with or immediately following a meal.     promethazine (PHENERGAN) 25 MG tablet Take 1 tablet (25 mg total) by mouth every 6 (six) hours as needed for nausea or vomiting. (Patient not taking: Reported on 02/08/2015) 6 tablet 0   No current facility-administered medications for this visit.     No past surgical history on file.   Allergies  Allergen Reactions   Amlodipine Other (See Comments)    "I feel like I'm dying"   Penicillins Other (See Comments)    Childhood allergy, unk rxn      No family history on file.   Social History Mr. Schuenemann reports that he quit smoking about 11 years ago. His smoking use included cigarettes. He has never used smokeless tobacco. Mr. Mesic reports no history of alcohol use.   Review of Systems CONSTITUTIONAL: No weight loss, fever, chills, weakness or fatigue.  HEENT: Eyes: No visual loss, blurred vision, double vision or yellow sclerae.No hearing loss, sneezing, congestion, runny nose or sore throat.  SKIN: No rash or itching.  CARDIOVASCULAR:  RESPIRATORY: No shortness of breath, cough or sputum.  GASTROINTESTINAL: No anorexia,  nausea, vomiting or diarrhea. No abdominal pain or blood.  GENITOURINARY: No burning on urination, no polyuria NEUROLOGICAL: No headache, dizziness, syncope, paralysis, ataxia, numbness or tingling in the extremities. No change in bowel or bladder control.  MUSCULOSKELETAL: No muscle, back pain, joint pain or stiffness.  LYMPHATICS: No enlarged nodes. No history of splenectomy.  PSYCHIATRIC: No history of depression or anxiety.  ENDOCRINOLOGIC: No reports of sweating, cold or heat intolerance. No polyuria or polydipsia.  Marland Kitchen   Physical Examination There were no vitals filed for this visit. There were no vitals filed for this  visit.  Gen: resting comfortably, no acute distress HEENT: no scleral icterus, pupils equal round and reactive, no palptable cervical adenopathy,  CV Resp: Clear to auscultation bilaterally GI: abdomen is soft, non-tender, non-distended, normal bowel sounds, no hepatosplenomegaly MSK: extremities are warm, no edema.  Skin: warm, no rash Neuro:  no focal deficits Psych: appropriate affect   Diagnostic Studies     Assessment and Plan        Antoine Poche, M.D., F.A.C.C.
